# Patient Record
Sex: Male | Born: 1960 | Race: White | Hispanic: No | Marital: Single | Smoking: Never smoker
Health system: Southern US, Community
[De-identification: ages and names within clinical notes are randomized; demographics above are authoritative.]

## PROBLEM LIST (undated history)

## (undated) DIAGNOSIS — G809 Cerebral palsy, unspecified: Secondary | ICD-10-CM

## (undated) HISTORY — PX: ORIF FIBULA FRACTURE: SHX2121

## (undated) HISTORY — PX: TENDON RELEASE: SHX230

---

## 2011-01-29 ENCOUNTER — Observation Stay (HOSPITAL_COMMUNITY): Payer: Worker's Compensation | Admitting: Anesthesiology

## 2011-01-29 ENCOUNTER — Other Ambulatory Visit: Payer: Self-pay

## 2011-01-29 ENCOUNTER — Observation Stay (HOSPITAL_COMMUNITY)
Admission: EM | Admit: 2011-01-29 | Discharge: 2011-01-30 | Disposition: A | Discharge status: Home / Self Care | Payer: Worker's Compensation | Attending: Orthopedic Surgery | Admitting: Orthopedic Surgery

## 2011-01-29 ENCOUNTER — Encounter (HOSPITAL_COMMUNITY): Payer: Self-pay | Admitting: Anesthesiology

## 2011-01-29 ENCOUNTER — Emergency Department (HOSPITAL_COMMUNITY): Payer: Worker's Compensation

## 2011-01-29 ENCOUNTER — Encounter (HOSPITAL_COMMUNITY)
Admission: EM | Disposition: A | Discharge status: Home / Self Care | Payer: Self-pay | Source: Home / Self Care | Attending: Emergency Medicine

## 2011-01-29 ENCOUNTER — Encounter (HOSPITAL_COMMUNITY): Payer: Self-pay

## 2011-01-29 DIAGNOSIS — W540XXA Bitten by dog, initial encounter: Secondary | ICD-10-CM | POA: Insufficient documentation

## 2011-01-29 DIAGNOSIS — S62502B Fracture of unspecified phalanx of left thumb, initial encounter for open fracture: Secondary | ICD-10-CM

## 2011-01-29 DIAGNOSIS — S62639B Displaced fracture of distal phalanx of unspecified finger, initial encounter for open fracture: Principal | ICD-10-CM | POA: Insufficient documentation

## 2011-01-29 DIAGNOSIS — Y990 Civilian activity done for income or pay: Secondary | ICD-10-CM | POA: Insufficient documentation

## 2011-01-29 DIAGNOSIS — Z23 Encounter for immunization: Secondary | ICD-10-CM | POA: Insufficient documentation

## 2011-01-29 DIAGNOSIS — Y929 Unspecified place or not applicable: Secondary | ICD-10-CM | POA: Insufficient documentation

## 2011-01-29 HISTORY — PX: ORIF FINGER FRACTURE: SHX2122

## 2011-01-29 HISTORY — DX: Cerebral palsy, unspecified: G80.9

## 2011-01-29 LAB — POCT I-STAT, CHEM 8
Calcium, Ion: 1.16 mmol/L (ref 1.12–1.32)
Chloride: 106 mEq/L (ref 96–112)
HCT: 43 % (ref 39.0–52.0)
Hemoglobin: 14.6 g/dL (ref 13.0–17.0)
TCO2: 25 mmol/L (ref 0–100)

## 2011-01-29 SURGERY — OPEN REDUCTION INTERNAL FIXATION (ORIF) METACARPAL (FINGER) FRACTURE
Anesthesia: General | Laterality: Left

## 2011-01-29 MED ORDER — HYDROMORPHONE HCL PF 1 MG/ML IJ SOLN
0.2500 mg | INTRAMUSCULAR | Status: DC | PRN
  Administered 2011-01-29 (×2): 0.5 mg via INTRAVENOUS

## 2011-01-29 MED ORDER — CLINDAMYCIN HCL 300 MG PO CAPS: 300.0000 mg | ORAL_CAPSULE | Freq: Three times a day (TID) | ORAL | Status: AC

## 2011-01-29 MED ORDER — SODIUM CHLORIDE 0.9 % IV SOLN
INTRAVENOUS | Status: DC | PRN
  Administered 2011-01-29: 18:00:00 via INTRAVENOUS

## 2011-01-29 MED ORDER — PROPOFOL 10 MG/ML IV EMUL
INTRAVENOUS | Status: DC | PRN
  Administered 2011-01-29: 300 mg via INTRAVENOUS

## 2011-01-29 MED ORDER — GLYCOPYRROLATE 0.2 MG/ML IJ SOLN
INTRAMUSCULAR | Status: DC | PRN
  Administered 2011-01-29: .2 mg via INTRAVENOUS

## 2011-01-29 MED ORDER — BUPIVACAINE HCL (PF) 0.25 % IJ SOLN
INTRAMUSCULAR | Status: DC | PRN
  Administered 2011-01-29: 9 mL

## 2011-01-29 MED ORDER — KCL IN DEXTROSE-NACL 20-5-0.45 MEQ/L-%-% IV SOLN
INTRAVENOUS | Status: DC
  Filled 2011-01-29: qty 1000

## 2011-01-29 MED ORDER — CHLORHEXIDINE GLUCONATE 4 % EX LIQD
60.0000 mL | Freq: Once | CUTANEOUS | Status: DC
  Filled 2011-01-29: qty 60

## 2011-01-29 MED ORDER — ALPRAZOLAM 0.5 MG PO TABS
0.5000 mg | ORAL_TABLET | Freq: Four times a day (QID) | ORAL | Status: DC | PRN
  Administered 2011-01-30: 0.5 mg via ORAL
  Filled 2011-01-29: qty 1

## 2011-01-29 MED ORDER — ZOLPIDEM TARTRATE 5 MG PO TABS: 5.0000 mg | ORAL_TABLET | Freq: Every evening | ORAL | Status: DC | PRN

## 2011-01-29 MED ORDER — FENTANYL CITRATE 0.05 MG/ML IJ SOLN
INTRAMUSCULAR | Status: AC
  Filled 2011-01-29: qty 2

## 2011-01-29 MED ORDER — FENTANYL CITRATE 0.05 MG/ML IJ SOLN
50.0000 ug | INTRAMUSCULAR | Status: DC | PRN
  Administered 2011-01-29: 50 ug via INTRAVENOUS

## 2011-01-29 MED ORDER — CLINDAMYCIN PHOSPHATE 600 MG/4ML IJ SOLN
INTRAMUSCULAR | Status: AC
  Filled 2011-01-29: qty 4

## 2011-01-29 MED ORDER — FENTANYL CITRATE 0.05 MG/ML IJ SOLN
INTRAMUSCULAR | Status: DC | PRN
  Administered 2011-01-29: 100 ug via INTRAVENOUS

## 2011-01-29 MED ORDER — RABIES VACCINE, PCEC IM SUSR
1.0000 mL | Freq: Once | INTRAMUSCULAR | Status: AC
  Administered 2011-01-29: 1 mL via INTRAMUSCULAR
  Filled 2011-01-29: qty 1

## 2011-01-29 MED ORDER — OXYCODONE-ACETAMINOPHEN 10-325 MG PO TABS: 1.0000 | ORAL_TABLET | ORAL | Status: AC | PRN

## 2011-01-29 MED ORDER — HYDROMORPHONE HCL PF 1 MG/ML IJ SOLN
INTRAMUSCULAR | Status: AC
  Filled 2011-01-29: qty 1

## 2011-01-29 MED ORDER — MORPHINE SULFATE 2 MG/ML IJ SOLN: 0.0500 mg/kg | INTRAMUSCULAR | Status: DC | PRN

## 2011-01-29 MED ORDER — METHOCARBAMOL 500 MG PO TABS
500.0000 mg | ORAL_TABLET | Freq: Four times a day (QID) | ORAL | Status: DC | PRN
  Administered 2011-01-30: 500 mg via ORAL
  Filled 2011-01-29: qty 1

## 2011-01-29 MED ORDER — DEXTROSE 5 % IV SOLN
INTRAVENOUS | Status: AC
  Filled 2011-01-29: qty 50

## 2011-01-29 MED ORDER — MEPERIDINE HCL 25 MG/ML IJ SOLN: 6.2500 mg | INTRAMUSCULAR | Status: DC | PRN

## 2011-01-29 MED ORDER — CLINDAMYCIN PHOSPHATE 600 MG/50ML IV SOLN
600.0000 mg | Freq: Three times a day (TID) | INTRAVENOUS | Status: DC
  Administered 2011-01-29 – 2011-01-30 (×2): 600 mg via INTRAVENOUS
  Filled 2011-01-29 (×5): qty 50

## 2011-01-29 MED ORDER — DOCUSATE SODIUM 100 MG PO CAPS
100.0000 mg | ORAL_CAPSULE | Freq: Two times a day (BID) | ORAL | Status: DC
  Administered 2011-01-29 – 2011-01-30 (×2): 100 mg via ORAL
  Filled 2011-01-29 (×3): qty 1

## 2011-01-29 MED ORDER — ONDANSETRON HCL 4 MG/2ML IJ SOLN: 4.0000 mg | Freq: Once | INTRAMUSCULAR | Status: DC | PRN

## 2011-01-29 MED ORDER — EPHEDRINE SULFATE 50 MG/ML IJ SOLN
INTRAMUSCULAR | Status: DC | PRN
  Administered 2011-01-29: 5 mg via INTRAVENOUS
  Administered 2011-01-29: 10 mg via INTRAVENOUS

## 2011-01-29 MED ORDER — CLINDAMYCIN PHOSPHATE 600 MG/50ML IV SOLN
INTRAVENOUS | Status: DC | PRN
  Administered 2011-01-29: 600 mg via INTRAVENOUS

## 2011-01-29 MED ORDER — DOCUSATE SODIUM 100 MG PO CAPS: 100.0000 mg | ORAL_CAPSULE | Freq: Two times a day (BID) | ORAL | Status: AC

## 2011-01-29 MED ORDER — CHLORPROMAZINE HCL 25 MG PO TABS
25.0000 mg | ORAL_TABLET | Freq: Three times a day (TID) | ORAL | Status: DC | PRN
  Filled 2011-01-29: qty 1

## 2011-01-29 MED ORDER — DEXTROSE 5 % IV SOLN
500.0000 mg | Freq: Four times a day (QID) | INTRAVENOUS | Status: DC | PRN
  Filled 2011-01-29: qty 5

## 2011-01-29 MED ORDER — ONDANSETRON HCL 4 MG PO TABS: 4.0000 mg | ORAL_TABLET | Freq: Three times a day (TID) | ORAL | Status: AC | PRN

## 2011-01-29 MED ORDER — CLINDAMYCIN PHOSPHATE 600 MG/50ML IV SOLN
600.0000 mg | INTRAVENOUS | Status: DC
  Filled 2011-01-29: qty 50

## 2011-01-29 MED ORDER — HYDROMORPHONE HCL PF 1 MG/ML IJ SOLN
0.5000 mg | INTRAMUSCULAR | Status: DC | PRN
  Administered 2011-01-29 – 2011-01-30 (×3): 1 mg via INTRAVENOUS
  Filled 2011-01-29 (×3): qty 1

## 2011-01-29 MED ORDER — VITAMIN C 500 MG PO TABS
1000.0000 mg | ORAL_TABLET | Freq: Every day | ORAL | Status: DC
  Administered 2011-01-29 – 2011-01-30 (×2): 1000 mg via ORAL
  Filled 2011-01-29 (×2): qty 2

## 2011-01-29 MED ORDER — SODIUM CHLORIDE 0.9 % IV SOLN
INTRAVENOUS | Status: DC
  Administered 2011-01-29: 16:00:00 via INTRAVENOUS

## 2011-01-29 MED ORDER — ONDANSETRON HCL 4 MG/2ML IJ SOLN
4.0000 mg | Freq: Four times a day (QID) | INTRAMUSCULAR | Status: DC | PRN
  Administered 2011-01-30: 4 mg via INTRAVENOUS
  Filled 2011-01-29: qty 2

## 2011-01-29 MED ORDER — CEFAZOLIN SODIUM-DEXTROSE 2-3 GM-% IV SOLR
2.0000 g | INTRAVENOUS | Status: AC
  Administered 2011-01-29: 2 g via INTRAVENOUS
  Filled 2011-01-29 (×2): qty 50

## 2011-01-29 MED ORDER — HYDROCODONE-ACETAMINOPHEN 5-325 MG PO TABS: 1.0000 | ORAL_TABLET | ORAL | Status: DC | PRN

## 2011-01-29 MED ORDER — DIPHENHYDRAMINE HCL 25 MG PO CAPS: 25.0000 mg | ORAL_CAPSULE | Freq: Four times a day (QID) | ORAL | Status: DC | PRN

## 2011-01-29 MED ORDER — OXYCODONE HCL 5 MG PO TABS
5.0000 mg | ORAL_TABLET | ORAL | Status: DC | PRN
  Administered 2011-01-29: 5 mg via ORAL
  Administered 2011-01-30 (×2): 10 mg via ORAL
  Filled 2011-01-29 (×2): qty 2
  Filled 2011-01-29: qty 1

## 2011-01-29 MED ORDER — ADULT MULTIVITAMIN W/MINERALS CH
1.0000 | ORAL_TABLET | Freq: Every day | ORAL | Status: DC
  Administered 2011-01-29 – 2011-01-30 (×2): 1 via ORAL
  Filled 2011-01-29 (×2): qty 1

## 2011-01-29 MED ORDER — FENTANYL CITRATE 0.05 MG/ML IJ SOLN
50.0000 ug | INTRAMUSCULAR | Status: AC | PRN
  Administered 2011-01-29 (×2): 50 ug via INTRAVENOUS
  Filled 2011-01-29 (×2): qty 2

## 2011-01-29 MED ORDER — KCL IN DEXTROSE-NACL 20-5-0.45 MEQ/L-%-% IV SOLN
INTRAVENOUS | Status: DC
  Administered 2011-01-29: 22:00:00 via INTRAVENOUS
  Filled 2011-01-29 (×2): qty 1000

## 2011-01-29 MED ORDER — ONDANSETRON HCL 4 MG/2ML IJ SOLN
INTRAMUSCULAR | Status: DC | PRN
  Administered 2011-01-29: 4 mg via INTRAVENOUS

## 2011-01-29 MED ORDER — CLONAZEPAM 2 MG PO TABS
2.0000 mg | ORAL_TABLET | Freq: Every day | ORAL | Status: DC
  Administered 2011-01-30: 2 mg via ORAL
  Filled 2011-01-29: qty 2

## 2011-01-29 MED ORDER — EPINEPHRINE HCL 0.1 MG/ML IJ SOLN
INTRAMUSCULAR | Status: AC
  Filled 2011-01-29: qty 80

## 2011-01-29 MED ORDER — ONDANSETRON HCL 4 MG PO TABS: 4.0000 mg | ORAL_TABLET | Freq: Four times a day (QID) | ORAL | Status: DC | PRN

## 2011-01-29 MED ORDER — MIDAZOLAM HCL 5 MG/5ML IJ SOLN
INTRAMUSCULAR | Status: DC | PRN
  Administered 2011-01-29: 2 mg via INTRAVENOUS

## 2011-01-29 MED ORDER — ASPIRIN 325 MG PO TABS
325.0000 mg | ORAL_TABLET | Freq: Two times a day (BID) | ORAL | Status: DC
  Administered 2011-01-29 – 2011-01-30 (×2): 325 mg via ORAL
  Filled 2011-01-29 (×3): qty 1

## 2011-01-29 MED ORDER — LACTATED RINGERS IV SOLN
INTRAVENOUS | Status: DC | PRN
  Administered 2011-01-29: 19:00:00 via INTRAVENOUS

## 2011-01-29 SURGICAL SUPPLY — 53 items
BANDAGE CONFORM 2  STR LF (GAUZE/BANDAGES/DRESSINGS) ×2 IMPLANT
BANDAGE ELASTIC 3 VELCRO ST LF (GAUZE/BANDAGES/DRESSINGS) IMPLANT
BANDAGE ELASTIC 4 VELCRO ST LF (GAUZE/BANDAGES/DRESSINGS) ×4 IMPLANT
BANDAGE GAUZE ELAST BULKY 4 IN (GAUZE/BANDAGES/DRESSINGS) IMPLANT
BNDG COHESIVE 1X5 TAN STRL LF (GAUZE/BANDAGES/DRESSINGS) ×2 IMPLANT
BNDG ELASTIC 2 VLCR STRL LF (GAUZE/BANDAGES/DRESSINGS) IMPLANT
BNDG ESMARK 4X9 LF (GAUZE/BANDAGES/DRESSINGS) ×2 IMPLANT
CAP PIN ORTHO PINK (CAP) IMPLANT
CAP PIN PROTECTOR ORTHO WHT (CAP) IMPLANT
CLOTH BEACON ORANGE TIMEOUT ST (SAFETY) ×2 IMPLANT
CORDS BIPOLAR (ELECTRODE) ×2 IMPLANT
COVER SURGICAL LIGHT HANDLE (MISCELLANEOUS) ×2 IMPLANT
CUFF TOURNIQUET SINGLE 18IN (TOURNIQUET CUFF) ×2 IMPLANT
CUFF TOURNIQUET SINGLE 24IN (TOURNIQUET CUFF) IMPLANT
DRAPE OEC MINIVIEW 54X84 (DRAPES) ×2 IMPLANT
DRAPE SURG 17X23 STRL (DRAPES) ×2 IMPLANT
DRSG ADAPTIC 3X8 NADH LF (GAUZE/BANDAGES/DRESSINGS) IMPLANT
DRSG XEROFORM 1X8 (GAUZE/BANDAGES/DRESSINGS) ×2 IMPLANT
GAUZE SPONGE 2X2 8PLY STRL LF (GAUZE/BANDAGES/DRESSINGS) ×1 IMPLANT
GAUZE XEROFORM 5X9 LF (GAUZE/BANDAGES/DRESSINGS) ×2 IMPLANT
GLOVE BIOGEL PI IND STRL 8.5 (GLOVE) ×1 IMPLANT
GLOVE BIOGEL PI INDICATOR 8.5 (GLOVE) ×1
GLOVE SURG ORTHO 8.0 STRL STRW (GLOVE) ×2 IMPLANT
GOWN PREVENTION PLUS XLARGE (GOWN DISPOSABLE) ×4 IMPLANT
GOWN STRL NON-REIN LRG LVL3 (GOWN DISPOSABLE) IMPLANT
K-WIRE SMTH SNGL TROCAR .028X4 (WIRE)
KIT BASIN OR (CUSTOM PROCEDURE TRAY) ×2 IMPLANT
KIT ROOM TURNOVER OR (KITS) ×2 IMPLANT
KWIRE 4.0 X .045IN (WIRE) ×4 IMPLANT
KWIRE SMTH SNGL TROCAR .028X4 (WIRE) IMPLANT
MANIFOLD NEPTUNE II (INSTRUMENTS) ×2 IMPLANT
NEEDLE HYPO 25GX1X1/2 BEV (NEEDLE) ×2 IMPLANT
NS IRRIG 1000ML POUR BTL (IV SOLUTION) ×2 IMPLANT
PACK ORTHO EXTREMITY (CUSTOM PROCEDURE TRAY) ×2 IMPLANT
PAD ARMBOARD 7.5X6 YLW CONV (MISCELLANEOUS) ×4 IMPLANT
PAD CAST 4YDX4 CTTN HI CHSV (CAST SUPPLIES) IMPLANT
PADDING CAST COTTON 4X4 STRL (CAST SUPPLIES)
PADDING UNDERCAST 2  STERILE (CAST SUPPLIES) IMPLANT
SOAP 2 % CHG 4 OZ (WOUND CARE) ×2 IMPLANT
SPLINT FINGER W/BULB (SOFTGOODS) ×2 IMPLANT
SPONGE GAUZE 2X2 8PLY STRL LF (GAUZE/BANDAGES/DRESSINGS) ×2 IMPLANT
SPONGE GAUZE 2X2 STER 10/PKG (GAUZE/BANDAGES/DRESSINGS) ×1
SPONGE GAUZE 4X4 12PLY (GAUZE/BANDAGES/DRESSINGS) IMPLANT
SUCTION FRAZIER TIP 10 FR DISP (SUCTIONS) ×2 IMPLANT
SUT CHROMIC 5 0 P 3 (SUTURE) ×2 IMPLANT
SUT MERSILENE 4 0 P 3 (SUTURE) IMPLANT
SUT PROLENE 4 0 PS 2 18 (SUTURE) ×4 IMPLANT
SYR CONTROL 10ML LL (SYRINGE) ×2 IMPLANT
TOWEL OR 17X24 6PK STRL BLUE (TOWEL DISPOSABLE) ×2 IMPLANT
TOWEL OR 17X26 10 PK STRL BLUE (TOWEL DISPOSABLE) ×2 IMPLANT
TUBE CONNECTING 12X1/4 (SUCTIONS) ×2 IMPLANT
UNDERPAD 30X30 INCONTINENT (UNDERPADS AND DIAPERS) ×2 IMPLANT
WATER STERILE IRR 1000ML POUR (IV SOLUTION) IMPLANT

## 2011-01-29 NOTE — H&P (Signed)
Trevor Lowe is an 50 y.o. male.   Chief Complaint: DOG BITE TO LEFT THUMB HPI: PT AT WORK SUSTAINED DOG BITE PT WITH OPEN WOUND TO LEFT THUMB TIP AND NEAR AMPUTATION PT HERE FOR TREATMENT AND SURGERY ON LEFT THUMB RABIES VACCINE STARTED IN ED   Past Medical History  Diagnosis Date  . Cerebral palsy     History reviewed. No pertinent past surgical history.  History reviewed. No pertinent family history. Social History:  reports that he has never smoked. He does not have any smokeless tobacco history on file. He reports that he drinks alcohol. He reports that he does not use illicit drugs.  Allergies:  Allergies  Allergen Reactions  . Codeine Nausea And Vomiting    Medications Prior to Admission  Medication Dose Route Frequency Provider Last Rate Last Dose  . 0.9 %  sodium chloride infusion   Intravenous Continuous Laray Anger, DO 100 mL/hr at 01/29/11 1628    . ceFAZolin (ANCEF) IVPB 2 g/50 mL premix  2 g Intravenous To Major Laray Anger, DO   2 g at 01/29/11 1628  . chlorhexidine (HIBICLENS) 4 % liquid 4 application  60 mL Topical Once Sharma Covert, MD      . clindamycin (CLEOCIN) 600 MG/4ML injection           . clindamycin (CLEOCIN) IVPB 600 mg  600 mg Intravenous 60 min Pre-Op Sharma Covert, MD      . dextrose 5 % and 0.45 % NaCl with KCl 20 mEq/L infusion   Intravenous Continuous Sharma Covert, MD      . dextrose 5 % solution           . fentaNYL (SUBLIMAZE) injection 50 mcg  50 mcg Intravenous Q30 min PRN Laray Anger, DO   50 mcg at 01/29/11 1619  . rabies vaccine, PCEC (RABAVERT) injection 1 mL  1 mL Intramuscular Once Laray Anger, DO   1 mL at 01/29/11 1613   No current outpatient prescriptions on file as of 01/29/2011.    Results for orders placed during the hospital encounter of 01/29/11 (from the past 48 hour(s))  POCT I-STAT, CHEM 8     Status: Abnormal   Collection Time   01/29/11  3:27 PM      Component Value Range Comment   Sodium 142  135 - 145 (mEq/L)    Potassium 3.7  3.5 - 5.1 (mEq/L)    Chloride 106  96 - 112 (mEq/L)    BUN 12  6 - 23 (mg/dL)    Creatinine, Ser 0.63  0.50 - 1.35 (mg/dL)    Glucose, Bld 016 (*) 70 - 99 (mg/dL)    Calcium, Ion 0.10  1.12 - 1.32 (mmol/L)    TCO2 25  0 - 100 (mmol/L)    Hemoglobin 14.6  13.0 - 17.0 (g/dL)    HCT 93.2  35.5 - 73.2 (%)    Dg Hand 2 View Left  01/29/2011  *RADIOLOGY REPORT*  Clinical Data: Dog bite, laceration to the.  LEFT HAND - 2 VIEW  Comparison: None.  Findings: There is a fracture through the metaphysis of the distal phalanx of the first digit.  There is a ventral displacement by approximately one bone width.  Fracture does not appear to enter the articular surface.  There is a small 3 mm sliver like foreign body along the radial side of the first metacarpal.  IMPRESSION:  Horizontal metaphyseal fracture of the distal phalanx,  first digit on the right.  Small foreign body along the radial surface of the first metacarpal.  Original Report Authenticated By: Genevive Bi, M.D.    NO RECENT ILLNESSES OR HOSPITALIZATIONS  Blood pressure 118/65, pulse 90, temperature 98.2 F (36.8 C), temperature source Oral, resp. rate 20, SpO2 95.00%. General Appearance:  Alert, cooperative, no distress, appears stated age  Head:  Normocephalic, without obvious abnormality, atraumatic  Eyes:  Pupils equal, conjunctiva/corneas clear,         Throat: Lips, mucosa, and tongue normal; teeth and gums normal  Neck: No visible masses     Lungs:   respirations unlabored  Chest Wall:  No tenderness or deformity  Heart:  Regular rate and rhythm,  Abdomen:   Soft, non-tender,         Extremities: LEFT THUMB: NEAR AMPUTATION TO LEFT THUMB, THUMB DISTAL TIP PERFUSED. UNABLE TO FLEX THUMB IP JOINT. SENSATION TO LT ALTERED DISTALLY SMALL PUNCTURE WOUNDS TO INDEX AND LONG NO FULL THICKNESS WOUNDS GOOD DIGITAL MOBILITY  Pulses: 2+ and symmetric  Skin: Skin color, texture, turgor  normal, no rashes or lesions     Neurologic: Normal    Assessment/Plan Left thumb open distal phalanx fracture from dog bite  I/D of the left thumb and open reduction and internal fixation of thumb distal phalanx fracture  R/B/A DISCUSSED WITH PT IN HOLDING AREA  PT VOICED UNDERSTANDING OF PLAN CONSENT SIGNED DAY OF SURGERY PT SEEN AND EXAMINED PRIOR TO OPERATIVE PROCEDURE/DAY OF SURGERY SITE MARKED. QUESTIONS ANSWERED WILL BE ADMITTED FOLLOWING SURGERY  Sharma Covert 01/29/2011, 5:54 PM

## 2011-01-29 NOTE — Transfer of Care (Signed)
Immediate Anesthesia Transfer of Care Note  Patient: Trevor Lowe  Procedure(s) Performed:  OPEN REDUCTION INTERNAL FIXATION (ORIF) METACARPAL (FINGER) FRACTURE  Patient Location: PACU  Anesthesia Type: General  Level of Consciousness: awake, alert  and oriented  Airway & Oxygen Therapy: Patient Spontanous Breathing and Patient connected to nasal cannula oxygen  Post-op Assessment: Report given to PACU RN and Post -op Vital signs reviewed and stable  Post vital signs: Reviewed and stable  Complications: No apparent anesthesia complications

## 2011-01-29 NOTE — Anesthesia Postprocedure Evaluation (Signed)
  Anesthesia Post-op Note  Patient: Trevor Lowe  Procedure(s) Performed:  OPEN REDUCTION INTERNAL FIXATION (ORIF) METACARPAL (FINGER) FRACTURE  Patient Location: PACU  Anesthesia Type: General  Level of Consciousness: awake, alert  and oriented  Airway and Oxygen Therapy: Patient Spontanous Breathing and Patient connected to nasal cannula oxygen  Post-op Pain: moderate  Post-op Assessment: Post-op Vital signs reviewed, Patient's Cardiovascular Status Stable, Respiratory Function Stable, Patent Airway, No signs of Nausea or vomiting and Pain level controlled  Post-op Vital Signs: Reviewed and stable  Complications: No apparent anesthesia complications

## 2011-01-29 NOTE — Anesthesia Procedure Notes (Signed)
Procedure Name: Intubation Date/Time: 01/29/2011 6:22 PM Performed by: Julianne Rice Z Pre-anesthesia Checklist: Patient identified, Timeout performed, Emergency Drugs available, Suction available and Patient being monitored Patient Re-evaluated:Patient Re-evaluated prior to inductionOxygen Delivery Method: Circle System Utilized Preoxygenation: Pre-oxygenation with 100% oxygen Intubation Type: IV induction Ventilation: Mask ventilation without difficulty Laryngoscope Size: Mac and 4 Grade View: Grade I Tube type: Oral Tube size: 8.0 mm Number of attempts: 1 Airway Equipment and Method: stylet Placement Confirmation: ETT inserted through vocal cords under direct vision,  breath sounds checked- equal and bilateral and positive ETCO2 Secured at: 23 cm Tube secured with: Tape Dental Injury: Teeth and Oropharynx as per pre-operative assessment

## 2011-01-29 NOTE — Brief Op Note (Signed)
01/29/2011  7:19 PM  PATIENT:  Trevor Lowe  51 y.o. male  PRE-OPERATIVE DIAGNOSIS:  dog bite  POST-OPERATIVE DIAGNOSIS:  * No post-op diagnosis entered *  PROCEDURE:  Procedure(s): OPEN REDUCTION INTERNAL FIXATION (ORIF) METACARPAL (FINGER) FRACTURE  SURGEON:  Surgeon(s): Sharma Covert, MD  PHYSICIAN ASSISTANT:   ASSISTANTS: none   ANESTHESIA:   general  EBL:     BLOOD ADMINISTERED:none  DRAINS: none   LOCAL MEDICATIONS USED:  MARCAINE 9CC  SPECIMEN:  No Specimen  DISPOSITION OF SPECIMEN:  N/A  COUNTS:  YES  TOURNIQUET:  * Missing tourniquet times found for documented tourniquets in log:  21197 *  DICTATION: .Note written in EPIC  PLAN OF CARE: Admit for overnight observation  PATIENT DISPOSITION:  PACU - hemodynamically stable.   Delay start of Pharmacological VTE agent (>24hrs) due to surgical blood loss or risk of bleeding:  {YES/NO/NOT APPLICABLE:20182

## 2011-01-29 NOTE — Op Note (Signed)
PREOPERATIVE DIAGNOSIS: Open left thumb distal phalanx fracture.  Dog Bite  POSTOPERATIVE DIAGNOSIS: Open left thumb distal phalanx fracture Dog Bite  ATTENDING PHYSICIAN: Sharma Covert IV, MD who was scrubbed and present  for the entire procedure.   ASSISTANT SURGEON: None.   ANESTHESIA: General via LMA.   SURGICAL PROCEDURE:  1. Open debridement of open distal phalangeal fracture, debridement of  skin, subcutaneous tissue, and bone associated with open fracture.  2. Open treatment of left thumb distal phalanx fracture requiring  internal fixation.  3. Left thumb nail bed repair.  4. Radiographs 2 view of the left thumb.   SURGICAL IMPLANTS: One 0.045 K-wire.  SURGICAL INDICATIONS: Trevor Lowe is a 51 year old gentleman who  sustained an open thumb injury while at work, bitten by a dog. Risks, benefits, and  alternatives were discussed in detail with the patient and signed  informed consent was obtained. Risks include, but not limited to  bleeding, infection, damage to nearby nerves, arteries, or tendons, loss  of motion of the elbow, wrist and digits, and need for further surgical  intervention.   DESCRIPTION OF PROCEDURE: The patient was properly identified in the  preop holding area and mark with a permanent marker made on the left  thumb to indicate the correct operative site. The patient then brought  back to the operating room, placed supine on anesthesia room table and  general anesthesia was administered. The patient tolerated this well.  Well-padded tourniquet was then placed on the left brachium and sealed  with a 1000 drape. The left upper extremity was then prepped and draped  in normal sterile fashion. Time-out was called, correct side was  identified, and procedure was then begun. Attention then turned to the  left thumb. The open fracture site skin was then extended both  proximally and distally. Excisional debridement was then carried out  sharply with  knife, curettes, and rongeurs at the open fracture site of  the skin, subcutaneous tissue, and small bone fragments. The wound was  then copiously irrigated after excisional debridement. Thorough wound  irrigation done throughout. Following this, the fracture line extending  through the most proximal area of the germinal matrix. The 0.045 K-wire  was then placed in an antegrade direction and then drilled retrograde  across the fracture site across the distal interphalangeal joint for  additional purchase. This was confirmed using mini C-arm in all planes.  The K-wire was then cut and bent and left out of the skin. After  internal fixation of distal phalangeal fracture, the wound was then  thoroughly irrigated. After the wound was thoroughly irrigated, the  nail bed was then repaired with simple 5-0 chromic sutures. After  repair of the nail bed, the tourniquet was deflated with good perfusion  of the thumb tip. The skin was then closed using simple Prolene  sutures. Adaptic dressing and sterile compressive bandage then applied.  An 8 mL of 0.25% Marcaine infiltrated locally. The patient was then  placed in a small finger splint, extubated, and taken to recovery room  in good condition.   Intraoperative radiographic 2 views of the finger and the thumb do show  the internal fixation in place with good reduction of the phalangeal  fracture.   POSTOPERATIVE PLAN: The patient discharged home, seen back in the  office in approximately 7 days for wound check, suture removal,  application of a tip protector splint pinned in for a total of 4 weeks,  and then begin DIP motion at  the 4 week mark x-rays at each visit.

## 2011-01-29 NOTE — ED Provider Notes (Signed)
History     CSN: [id number]  Arrival date & time 01/29/11  1421   Chief Complaint  Patient presents with  . Animal Bite    HPI Pt was seen at 1440.   Per EMS and pt, c/o sudden onset and persistence of constant left hand pain that began PTA.  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  Pt is right handed.  Denies any other injuries.       Past Medical History  Diagnosis Date  . Cerebral palsy     History reviewed. No pertinent past surgical history.   History  Substance Use Topics  . Smoking status: Never Smoker   . Smokeless tobacco: Not on file  . Alcohol Use: Yes     social     Review of Systems ROS: Statement: All systems negative except as marked or noted in the HPI; Constitutional: Negative for fever and chills. ; ; Eyes: Negative for eye pain, redness and discharge. ; ; ENMT: Negative for ear pain, hoarseness, nasal congestion, sinus pressure and sore throat. ; ; Cardiovascular: Negative for chest pain, palpitations, diaphoresis, dyspnea and peripheral edema. ; ; Respiratory: Negative for cough, wheezing and stridor. ; ; Gastrointestinal: Negative for nausea, vomiting, diarrhea, abdominal pain, blood in stool, hematemesis, jaundice and rectal bleeding. . ; ; Genitourinary: Negative for dysuria, flank pain and hematuria. ; ; Musculoskeletal: Negative for back pain and neck pain. Negative for swelling and +left hand trauma.; ; Skin: Negative for pruritus, rash, blisters, bruising and skin lesion.; ; Neuro: Negative for headache, lightheadedness and neck stiffness. Negative for weakness, altered level of consciousness , altered mental status, extremity weakness, paresthesias, involuntary movement, seizure and syncope.     Allergies  Codeine  Home Medications   Current Outpatient Rx  Name Route Sig Dispense Refill  . CLONAZEPAM 1 MG PO TABS Oral Take 2 mg by mouth daily.      BP 118/65  Pulse 90  Temp(Src) 98.2 F (36.8 C) (Oral)   Resp 20  SpO2 95%  Physical Exam 1445: Physical examination:  Nursing notes reviewed; Vital signs and O2 SAT reviewed;  Constitutional: Well developed, Well nourished, Well hydrated, In no acute distress; Head:  Normocephalic, atraumatic; Eyes: EOMI, PERRL, No scleral icterus; ENMT: Mouth and pharynx normal, Mucous membranes moist; Neck: Supple, Full range of motion, No lymphadenopathy; Cardiovascular: Regular rate and rhythm, No murmur, rub, or gallop; Respiratory: Breath sounds clear & equal bilaterally, No rales, rhonchi, wheezes, or rub, Normal respiratory effort/excursion; Chest: Nontender, Movement normal; Abdomen: Soft, Nontender, Nondistended, Normal bowel sounds; Extremities: Pulses normal, +left distal thumb from IP to tip avulsed circumferentially; +entire thumb is TTP with tip of thumb pink.  Pt holding his left thumb and index finger in adduction and flexion and will not fully extend either due to c/o pain, otherwise no other obvious left hand or wrist injuries with fingers held in this position.  Other fingers appear pink/warm. No calf edema or asymmetry.; Neuro: AA&Ox3, Major CN grossly intact.  No gross focal motor or sensory deficits in extremities.; Skin: Color normal, Warm, Dry, Psych:  Anxious.   ED Course  Procedures   MDM  MDM Reviewed: nursing note and vitals Interpretation: x-ray and ECG    Date: 01/29/2011  Rate: 73  Rhythm: normal sinus rhythm  QRS Axis: normal  Intervals: normal  ST/T Wave abnormalities: normal  Conduction Disutrbances:none  Narrative Interpretation:   Old EKG Reviewed: none available.    Dg  Hand 2 View Left 01/29/2011  *RADIOLOGY REPORT*  Clinical Data: Dog bite, laceration to the.  LEFT HAND - 2 VIEW  Comparison: None.  Findings: There is a fracture through the metaphysis of the distal phalanx of the first digit.  There is a ventral displacement by approximately one bone width.  Fracture does not appear to enter the articular surface.  There  is a small 3 mm sliver like foreign body along the radial side of the first metacarpal.  IMPRESSION:  Horizontal metaphyseal fracture of the distal phalanx, first digit on the right.  Small foreign body along the radial surface of the first metacarpal.  Original Report Authenticated By: Genevive Bi, M.D.     1545:   Pt states his Td is UTD, needs rabies post-exposure vaccine booster per [facility] website (pt has already received vaccination series).  Dx testing d/w pt.  Questions answered.  Verb understanding, agreeable to admit/to OR with Hydrograph[employer] surveyor.  Has been NPO since approx 0900 this morning.  Will dose IV abx.  T/C to Hand Surgeon Dr. Orlan Leavens, case discussed, including:  HPI, pertinent PM/SHx, VS/PE, dx testing, ED course and treatment.  Agreeable to admit/take to OR.  He has placed orders in computer for ED RN, ED RN made aware.        Marlina Cataldi Allison Quarry, DO 01/30/11 1936

## 2011-01-29 NOTE — ED Notes (Signed)
Pt to the OR, report given

## 2011-01-29 NOTE — Anesthesia Preprocedure Evaluation (Addendum)
Anesthesia Evaluation  Patient identified by MRN, date of birth, ID band Patient awake    Reviewed: Allergy & Precautions, H&P , NPO status , Patient's Chart, lab work & pertinent test results  Airway Mallampati: I TM Distance: >3 FB   Mouth opening: Limited Mouth Opening  Dental  (+) Teeth Intact and Dental Advisory Given   Pulmonary  clear to auscultation        Cardiovascular Regular Normal    Neuro/Psych    GI/Hepatic   Endo/Other    Renal/GU      Musculoskeletal   Abdominal   Peds  Hematology   Anesthesia Other Findings   Reproductive/Obstetrics                          Anesthesia Physical Anesthesia Plan  ASA: II and Emergent  Anesthesia Plan: General   Post-op Pain Management:    Induction: Intravenous  Airway Management Planned: Oral ETT  Additional Equipment:   Intra-op Plan:   Post-operative Plan: Extubation in OR  Informed Consent: I have reviewed the patients History and Physical, chart, labs and discussed the procedure including the risks, benefits and alternatives for the proposed anesthesia with the patient or authorized representative who has indicated his/her understanding and acceptance.   Dental advisory given  Plan Discussed with: CRNA, Anesthesiologist and Surgeon  Anesthesia Plan Comments:        Anesthesia Quick Evaluation

## 2011-01-29 NOTE — ED Notes (Signed)
EMS reports male rotweiller with puppies, lunged at catchpole and bit left hand, thumb amputated, fingers avulsed, 16 mg morphine enroute,

## 2011-01-29 NOTE — Preoperative (Signed)
Beta Blockers   Reason not to administer Beta Blockers:Not Applicable 

## 2011-01-29 NOTE — ED Notes (Signed)
Patient undressed and in gown. All belongings bagged, Pulse ox and bp cuff on.

## 2011-01-29 NOTE — ED Notes (Signed)
Pt is Trevor Lowe

## 2011-01-30 ENCOUNTER — Encounter (HOSPITAL_COMMUNITY): Payer: Self-pay | Admitting: *Deleted

## 2011-01-31 ENCOUNTER — Encounter (HOSPITAL_COMMUNITY): Payer: Self-pay | Admitting: Orthopedic Surgery

## 2011-02-01 ENCOUNTER — Emergency Department (INDEPENDENT_AMBULATORY_CARE_PROVIDER_SITE_OTHER)
Admission: EM | Admit: 2011-02-01 | Discharge: 2011-02-01 | Disposition: A | Discharge status: Home / Self Care | Payer: Worker's Compensation | Source: Home / Self Care | Attending: Family Medicine | Admitting: Family Medicine

## 2011-02-01 ENCOUNTER — Encounter (HOSPITAL_COMMUNITY): Payer: Self-pay | Admitting: *Deleted

## 2011-02-01 DIAGNOSIS — Z23 Encounter for immunization: Secondary | ICD-10-CM

## 2011-02-01 MED ORDER — RABIES VACCINE, PCEC IM SUSR
1.0000 mL | Freq: Once | INTRAMUSCULAR | Status: AC
  Administered 2011-02-01: 1 mL via INTRAMUSCULAR

## 2011-02-01 MED ORDER — RABIES VACCINE, PCEC IM SUSR
INTRAMUSCULAR | Status: AC
  Filled 2011-02-01: qty 1

## 2011-02-01 NOTE — ED Notes (Signed)
Discussed pt.'s intractable pain with Dr. Artis Flock. He said to call the the surgeon and ask him what to do.  I called [phone] and they are going to page Dr. Melvyn Novas to call me.

## 2011-02-01 NOTE — ED Notes (Signed)
Pt. here for 2nd rabies vaccine for dog bite to L thumb. Pt. has large splint and bandage on L thumb. He is supposed to f/u with Dr. Melvyn Novas on 2/5.  Pt. rates pain 10/10

## 2011-02-01 NOTE — ED Notes (Signed)
Dr. Melvyn Novas called back and I told him about pt.'s pain and swelling. I told him his f/u appt. is not until 2/5. He said I could loosen the bandage and he will see the pt. 1/31 @ 0900. Pt. notified of change in appt. I loosened the coban around his wrist and the base of his thumb. Pt. instructed to keep hand elevated as much as possible.

## 2011-02-05 ENCOUNTER — Emergency Department (INDEPENDENT_AMBULATORY_CARE_PROVIDER_SITE_OTHER)
Admission: EM | Admit: 2011-02-05 | Discharge: 2011-02-05 | Disposition: A | Discharge status: Home / Self Care | Payer: Worker's Compensation | Source: Home / Self Care

## 2011-02-05 ENCOUNTER — Encounter (HOSPITAL_COMMUNITY): Payer: Self-pay | Admitting: Emergency Medicine

## 2011-02-05 DIAGNOSIS — Z23 Encounter for immunization: Secondary | ICD-10-CM

## 2011-02-05 MED ORDER — RABIES VACCINE, PCEC IM SUSR
INTRAMUSCULAR | Status: AC
  Filled 2011-02-05: qty 1

## 2011-02-05 MED ORDER — RABIES VACCINE, PCEC IM SUSR
1.0000 mL | Freq: Once | INTRAMUSCULAR | Status: AC
  Administered 2011-02-05: 1 mL via INTRAMUSCULAR

## 2011-02-05 NOTE — ED Notes (Signed)
Patient concerned for receiving unnecessary injections.  spoke to pharmacist , assured patient no consequences to receiving unnecessary injections, nor tolerance--specifically asked theses questions due to patient concern.  Patient agreed to injection

## 2011-02-05 NOTE — ED Notes (Signed)
Patient has returned to [facility] for 3rd injection in rabies series.

## 2011-02-09 ENCOUNTER — Telehealth (HOSPITAL_COMMUNITY): Payer: Self-pay | Admitting: *Deleted

## 2011-02-11 ENCOUNTER — Encounter (HOSPITAL_COMMUNITY): Payer: Self-pay | Admitting: *Deleted

## 2011-02-11 ENCOUNTER — Emergency Department (INDEPENDENT_AMBULATORY_CARE_PROVIDER_SITE_OTHER)
Admission: EM | Admit: 2011-02-11 | Discharge: 2011-02-11 | Disposition: A | Discharge status: Home / Self Care | Payer: Worker's Compensation | Source: Home / Self Care

## 2011-02-11 DIAGNOSIS — Z23 Encounter for immunization: Secondary | ICD-10-CM

## 2011-02-11 MED ORDER — RABIES VACCINE, PCEC IM SUSR
INTRAMUSCULAR | Status: AC
  Filled 2011-02-11: qty 1

## 2011-02-11 MED ORDER — RABIES VACCINE, PCEC IM SUSR
1.0000 mL | Freq: Once | INTRAMUSCULAR | Status: AC
  Administered 2011-02-11: 1 mL via INTRAMUSCULAR

## 2011-02-11 NOTE — ED Notes (Signed)
Pt. Here for last rabies vaccine for dog bite to L thumb. Pt. Was seen by Dr.Ortmann on 2/5 and it was not infected.  Seen today at PT and it was seen by Dr. Melvyn Novas also and redressed.  Has PT on 12/12, 12/13 and 12/15.

## 2011-02-24 NOTE — Discharge Summary (Signed)
NAME:  CYRIS, MAALOUF NO.:  [id number]  MEDICAL RECORD NO.:  [id number]  LOCATION:  5007                         FACILITY:  [facility]  PHYSICIAN:  Madelynn Done, MD  DATE OF BIRTH:  29-Apr-1960  DATE OF ADMISSION:  01/29/2011 DATE OF DISCHARGE:  01/30/2011                              DISCHARGE SUMMARY   ADMISSION DIAGNOSIS:  Left arm dog bite with open distal phalanx fracture.  DISCHARGE DIAGNOSIS:  Left arm dog bite with open distal phalanx fracture.  DISCHARGE MEDICATIONS:  See the computer.  HOSPITAL COURSE:  The patient tolerated overnight IV antibiotics and IV pain medications, felt ready for discharge on the day on January 30, 2011.  CONDITION AT DISCHARGE:  Good.  RECOMMENDATIONS AND FOLLOWUP:  In the computer.     Madelynn Done, MD     FWO/MEDQ  D:  02/23/2011  T:  02/23/2011  Job:  [id number]

## 2012-12-18 IMAGING — CR DG HAND 2V*L*
2 series · 2 of 2 positions shown · non-contrast
Comparison: None.

CLINICAL DATA: Dog bite, laceration to the.

LEFT HAND - 2 VIEW

[PA]
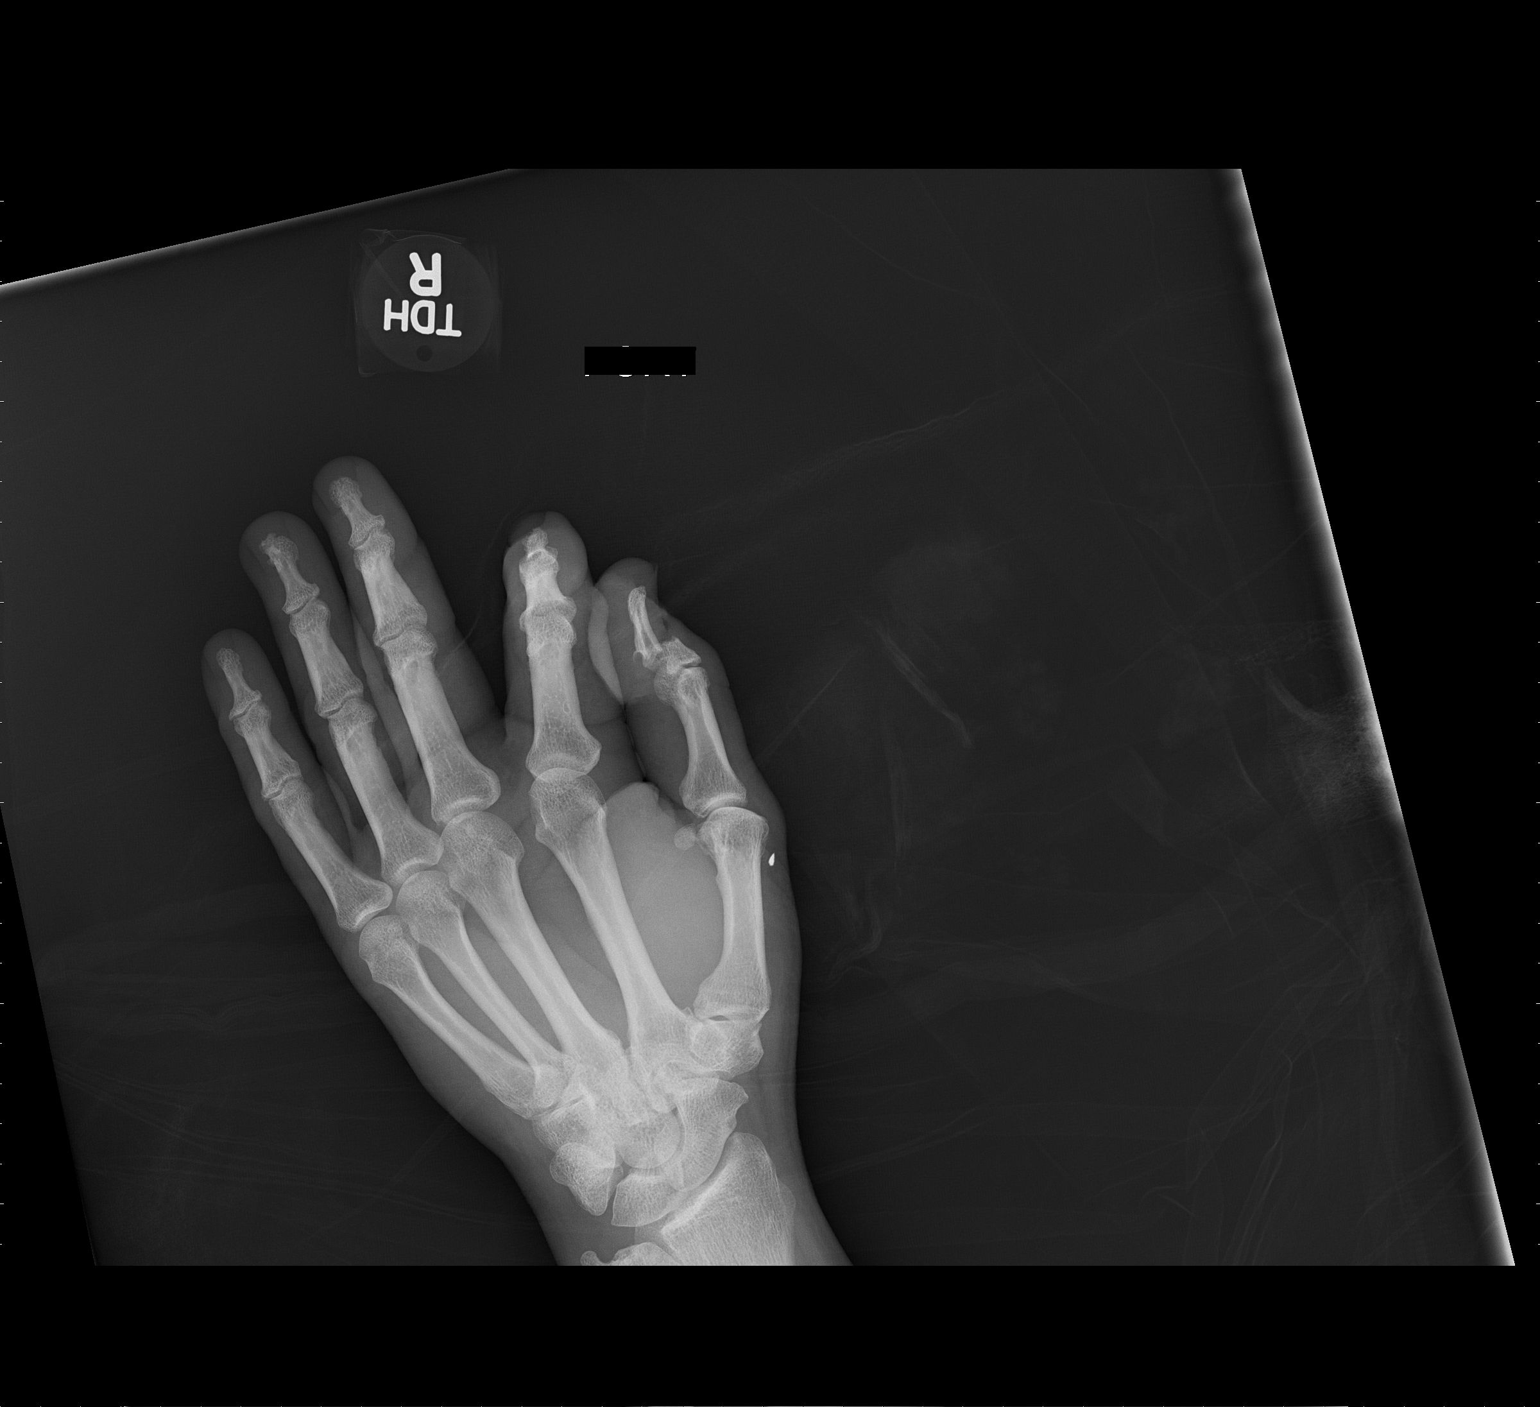

[lat hand]
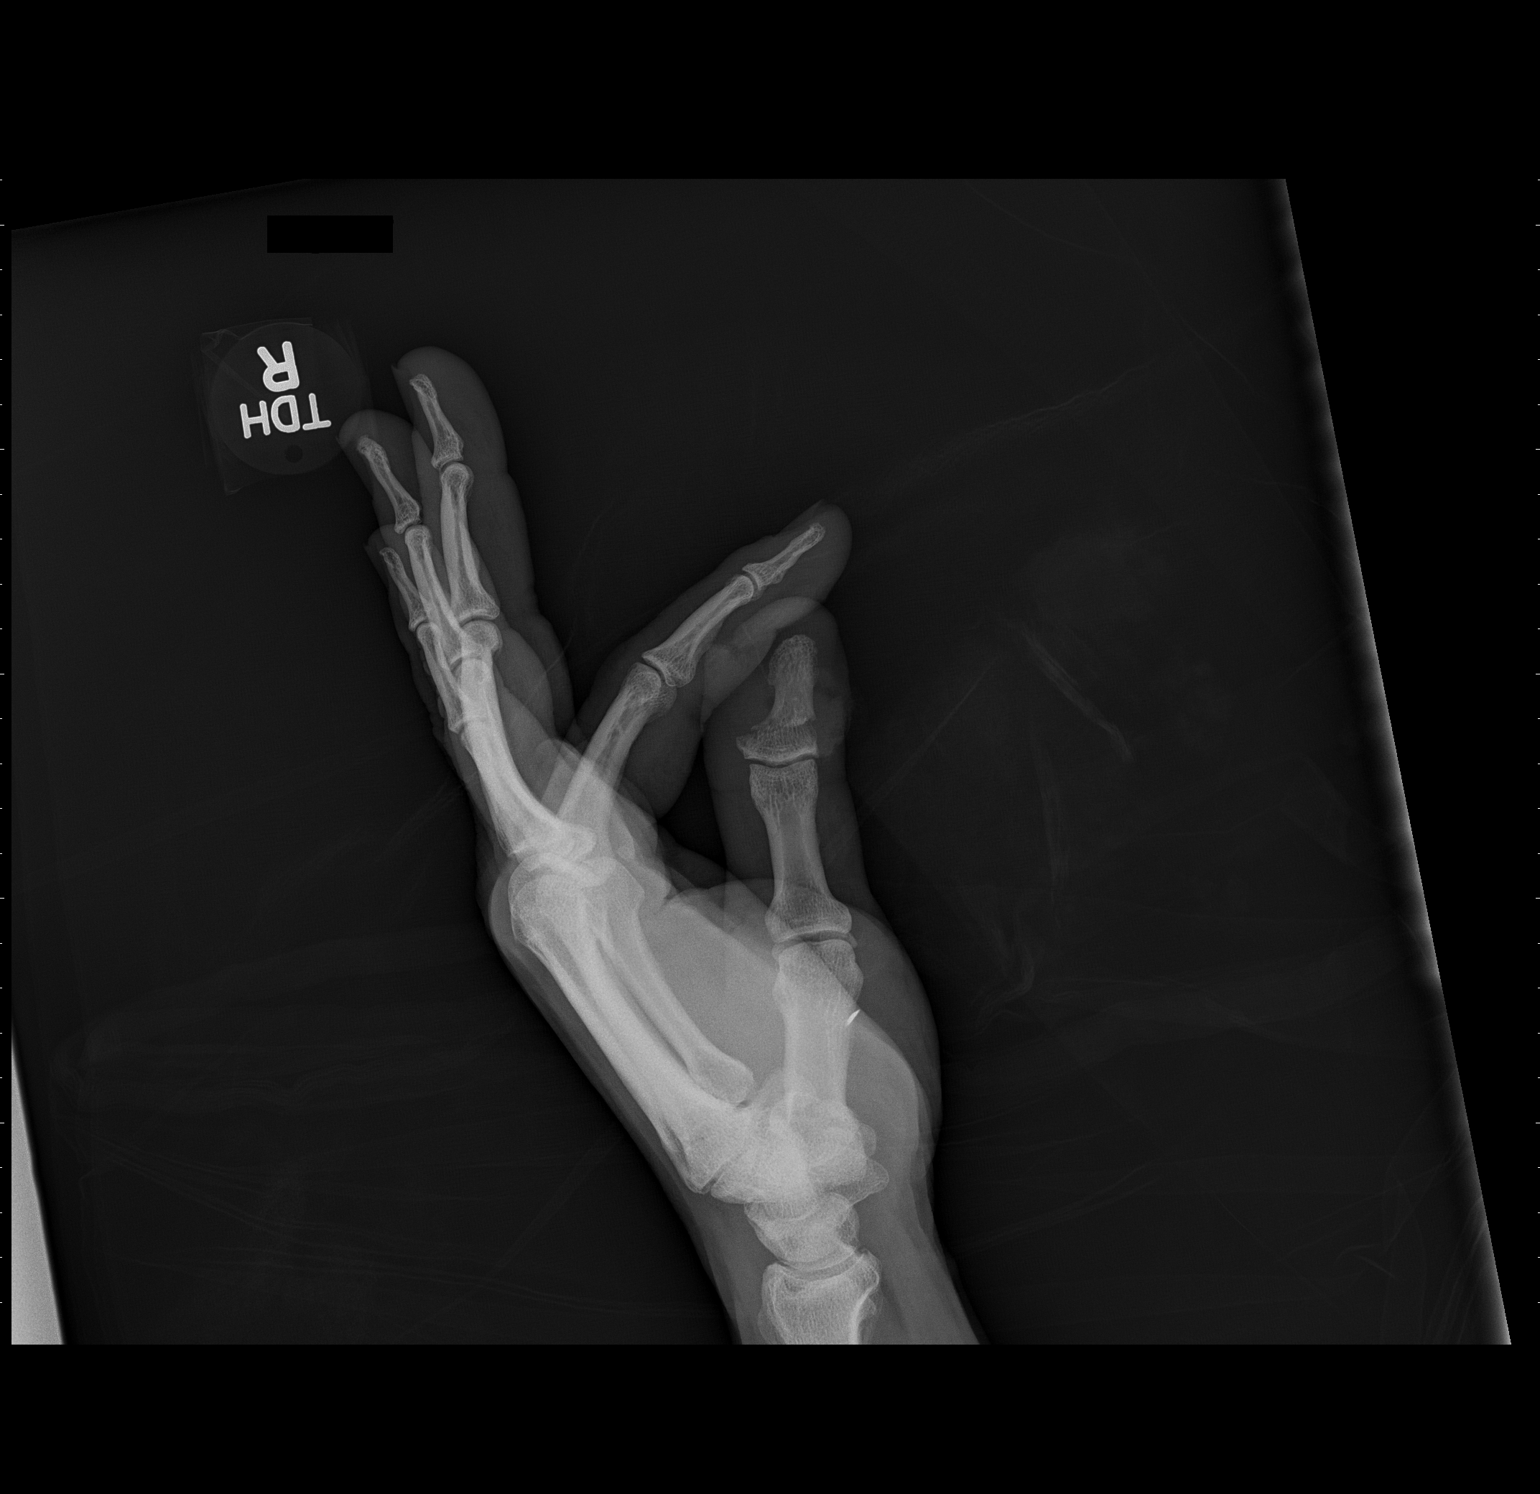

[2 of 2 positions shown; findings below may reference images not displayed]

FINDINGS: There is a fracture through the metaphysis of the distal
phalanx of the first digit.  There is a ventral displacement by
approximately one bone width.  Fracture does not appear to enter
the articular surface.

There is a small 3 mm sliver like foreign body along the radial
side of the first metacarpal.
IMPRESSION: Horizontal metaphyseal fracture of the distal phalanx, first digit
on the right.

Small foreign body along the radial surface of the first
metacarpal.

## 2012-12-19 ENCOUNTER — Telehealth: Payer: Self-pay | Admitting: *Deleted

## 2012-12-19 NOTE — Telephone Encounter (Addendum)
Pt complains of gout symptoms and request refill of Prednisone 20mg .  I advised Dr Al Corpus and will also advise his [employer].  Pt request rx to be sent to [facility].  Dr Al Corpus refilled the Prednisone 10mg  6 day pack, and have pt make an appt.  I left a message with Dr. Geryl Rankins orders.

## 2012-12-20 NOTE — Telephone Encounter (Signed)
Sure that will be fine and have him in for evaluation.

## 2012-12-21 ENCOUNTER — Other Ambulatory Visit: Payer: Self-pay | Admitting: Podiatry

## 2013-01-01 ENCOUNTER — Encounter: Payer: Self-pay | Admitting: Podiatry

## 2013-01-01 ENCOUNTER — Ambulatory Visit (INDEPENDENT_AMBULATORY_CARE_PROVIDER_SITE_OTHER): Payer: Medicare Other | Admitting: Podiatry

## 2013-01-01 ENCOUNTER — Ambulatory Visit (INDEPENDENT_AMBULATORY_CARE_PROVIDER_SITE_OTHER): Payer: Medicare Other

## 2013-01-01 VITALS — BP 118/75 | HR 69 | Resp 16 | Ht 72.0 in | Wt 190.0 lb

## 2013-01-01 DIAGNOSIS — M109 Gout, unspecified: Secondary | ICD-10-CM

## 2013-01-01 DIAGNOSIS — M7750 Other enthesopathy of unspecified foot: Secondary | ICD-10-CM

## 2013-01-01 DIAGNOSIS — M79671 Pain in right foot: Secondary | ICD-10-CM

## 2013-01-01 DIAGNOSIS — M79609 Pain in unspecified limb: Secondary | ICD-10-CM

## 2013-01-01 LAB — RHEUMATOID FACTOR: Rhuematoid fact SerPl-aCnc: <10 IU/mL (ref <=14)

## 2013-01-01 LAB — SEDIMENTATION RATE: Sed Rate: 4 mm/hr (ref 0–16)

## 2013-01-01 LAB — C-REACTIVE PROTEIN: CRP: <0.5 mg/dL (ref <0.60)

## 2013-01-01 NOTE — Progress Notes (Signed)
Trevor Lowe presents today stating that 2 weeks ago woke up had a red-hot swollen joint here. As he points to the first metatarsophalangeal joint of the right foot. States it feels some better than it did however it is still painful.  Objective: Vital signs are stable he is alert and oriented x3. He has pain on in range of motion of the first metatarsophalangeal joint of the right foot there is no overlying erythema edema saline is drainage or odor. Radiographic evaluation does demonstrate early after 3 changes to the first metatarsophalangeal joint of the right foot as well as the IP joint of the hallux right.  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 Rule out gout.  Plan: We will send him out for a blood draw looking for an elevated uric acid. At this point I injected Kenalog and local anesthetic to his first metatarsophalangeal joint intra-articularly after sterile Betadine skin prep. Should his uric acid level return elevated we will start him on allopurinol immediately.

## 2013-01-02 ENCOUNTER — Other Ambulatory Visit: Payer: Self-pay | Admitting: Podiatry

## 2013-01-02 ENCOUNTER — Telehealth: Payer: Self-pay | Admitting: *Deleted

## 2013-01-02 LAB — ANA: Anti Nuclear Antibody(ANA): NEGATIVE

## 2013-01-02 MED ORDER — ALLOPURINOL 100 MG PO TABS: 150.0000 mg | ORAL_TABLET | Freq: Every day | ORAL | Status: DC

## 2013-01-02 NOTE — Telephone Encounter (Signed)
I left a message at pt's home phone, with Dr Al Corpus' orders.

## 2013-01-02 NOTE — Telephone Encounter (Signed)
Message copied by Marissa Nestle on Wed Jan 02, 2013  2:09 PM ------      Message from: HYATT, MAX T      Created: Wed Jan 02, 2013 12:17 PM       Val let [name] know that his Uric acid level is high and that I want him started on Allopurinol immediately.  I have all ready called it in for him and I will fu with him at his scheduled appointment. ------

## 2013-01-22 ENCOUNTER — Encounter: Payer: Self-pay | Admitting: Podiatry

## 2013-01-22 ENCOUNTER — Ambulatory Visit (INDEPENDENT_AMBULATORY_CARE_PROVIDER_SITE_OTHER): Payer: Medicare Other | Admitting: Podiatry

## 2013-01-22 ENCOUNTER — Ambulatory Visit: Payer: Medicare Other | Admitting: Podiatry

## 2013-01-22 VITALS — BP 118/68 | HR 70 | Resp 16

## 2013-01-22 DIAGNOSIS — Z79899 Other long term (current) drug therapy: Secondary | ICD-10-CM

## 2013-01-22 DIAGNOSIS — M7751 Other enthesopathy of right foot: Secondary | ICD-10-CM

## 2013-01-22 DIAGNOSIS — M7750 Other enthesopathy of unspecified foot: Secondary | ICD-10-CM

## 2013-01-22 NOTE — Progress Notes (Signed)
Its doing great! As she refers to his right foot, hallux. He states that he is tolerating the allopurinol very well.  Objective: Vital signs are stable he is alert and oriented x3. After the injection 2 weeks ago he is no longer having any erythema edema cellulitis drainage or odor to the right foot and no pain on palpation or range of motion of the first metatarsophalangeal joint or IP joint hallux right.  Assessment: Hyperuricemia/gout currently being treated with allopurinol 150 mg daily.  Plan: I will followup with him in 3 months at which time a sed rate and a uric acid will be performed.

## 2013-04-23 ENCOUNTER — Ambulatory Visit: Payer: Medicare Other | Admitting: Podiatry

## 2013-05-07 ENCOUNTER — Ambulatory Visit (INDEPENDENT_AMBULATORY_CARE_PROVIDER_SITE_OTHER): Payer: Medicare Other | Admitting: Podiatry

## 2013-05-07 ENCOUNTER — Encounter: Payer: Self-pay | Admitting: Podiatry

## 2013-05-07 VITALS — BP 118/68 | HR 88 | Resp 16

## 2013-05-07 DIAGNOSIS — M109 Gout, unspecified: Secondary | ICD-10-CM

## 2013-05-07 DIAGNOSIS — Z79899 Other long term (current) drug therapy: Secondary | ICD-10-CM

## 2013-05-07 LAB — SEDIMENTATION RATE: Sed Rate: 4 mm/hr (ref 0–16)

## 2013-05-07 LAB — URIC ACID: Uric Acid, Serum: 7.2 mg/dL (ref 4.0–7.8)

## 2013-05-07 NOTE — Progress Notes (Signed)
Presents today for followup of her gouty foot left first metatarsophalangeal joint. He states he has not had any flares or any gout attacks since he is been taken medication. We did start him on 150 mg of allopurinol however he states that he is only taking 100 mg a day.  Objective: Vital signs are stable he is alert and oriented x3. No changes in the bilateral foot.  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  Plan: At this point we will continue 100 mg of allopurinol daily. His prescription was refilled. We also since him for blood work to evaluate uric acid and sedimentation rate. I will followup with him if necessary.

## 2013-05-07 NOTE — Patient Instructions (Signed)

## 2013-07-01 ENCOUNTER — Telehealth: Payer: Self-pay | Admitting: *Deleted

## 2013-07-01 NOTE — Telephone Encounter (Signed)
If Trevor Lowe is only taking one allopurinol the we should increase it to 200mg /day.  That should prevent break through gout attack.  Follow up with us should the gout recur.

## 2013-07-01 NOTE — Telephone Encounter (Signed)
I've got a flare up of Gout on both feet.  I'm on 1 tablet of Allopurinol 100mg .  What should I do about this?  Anything you can advise me, give me a call.  I returned his call.  He said that he takes the Allopurinol daily.  He said he took some Indomethacin for 2 days and that seemed to have helped.  It's gone away.  He stated he is about out of Allopurinol, will need a refill soon.  I told him I would let Dr. Al CorpusHyatt know.

## 2013-07-02 MED ORDER — ALLOPURINOL 100 MG PO TABS: 200.0000 mg | ORAL_TABLET | Freq: Every day | ORAL | Status: DC

## 2013-07-02 NOTE — Telephone Encounter (Signed)
I called and left several messages due to patient's voicemail cutting me off.  I informed him Dr. Al CorpusHyatt wants to change the prescription to 200mg  a day to avoid break through Gout attacks..  I e-scribed the prescription to your pharmacy.   Call with any questions.

## 2014-04-03 ENCOUNTER — Other Ambulatory Visit: Payer: Self-pay | Admitting: *Deleted

## 2014-04-03 MED ORDER — ALLOPURINOL 100 MG PO TABS: 200.0000 mg | ORAL_TABLET | Freq: Every day | ORAL | Status: AC

## 2015-03-18 DIAGNOSIS — R51 Headache: Secondary | ICD-10-CM | POA: Diagnosis not present

## 2015-03-18 DIAGNOSIS — J329 Chronic sinusitis, unspecified: Secondary | ICD-10-CM | POA: Diagnosis not present

## 2015-03-18 DIAGNOSIS — R509 Fever, unspecified: Secondary | ICD-10-CM | POA: Diagnosis not present

## 2015-03-18 DIAGNOSIS — Z299 Encounter for prophylactic measures, unspecified: Secondary | ICD-10-CM | POA: Diagnosis not present

## 2015-03-18 DIAGNOSIS — R0982 Postnasal drip: Secondary | ICD-10-CM | POA: Diagnosis not present

## 2015-03-18 DIAGNOSIS — Z789 Other specified health status: Secondary | ICD-10-CM | POA: Diagnosis not present

## 2015-05-12 DIAGNOSIS — Z299 Encounter for prophylactic measures, unspecified: Secondary | ICD-10-CM | POA: Diagnosis not present

## 2015-05-12 DIAGNOSIS — F419 Anxiety disorder, unspecified: Secondary | ICD-10-CM | POA: Diagnosis not present

## 2015-07-15 DIAGNOSIS — Z79899 Other long term (current) drug therapy: Secondary | ICD-10-CM | POA: Diagnosis not present

## 2015-07-15 DIAGNOSIS — Z125 Encounter for screening for malignant neoplasm of prostate: Secondary | ICD-10-CM | POA: Diagnosis not present

## 2015-07-15 DIAGNOSIS — E7800 Pure hypercholesterolemia, unspecified: Secondary | ICD-10-CM | POA: Diagnosis not present

## 2015-07-15 DIAGNOSIS — M109 Gout, unspecified: Secondary | ICD-10-CM | POA: Diagnosis not present

## 2015-07-15 DIAGNOSIS — Z1211 Encounter for screening for malignant neoplasm of colon: Secondary | ICD-10-CM | POA: Diagnosis not present

## 2015-07-15 DIAGNOSIS — Z6827 Body mass index (BMI) 27.0-27.9, adult: Secondary | ICD-10-CM | POA: Diagnosis not present

## 2015-07-15 DIAGNOSIS — Z299 Encounter for prophylactic measures, unspecified: Secondary | ICD-10-CM | POA: Diagnosis not present

## 2015-07-15 DIAGNOSIS — Z0000 Encounter for general adult medical examination without abnormal findings: Secondary | ICD-10-CM | POA: Diagnosis not present

## 2015-07-15 DIAGNOSIS — R5383 Other fatigue: Secondary | ICD-10-CM | POA: Diagnosis not present

## 2015-07-15 DIAGNOSIS — Z1389 Encounter for screening for other disorder: Secondary | ICD-10-CM | POA: Diagnosis not present

## 2015-07-15 DIAGNOSIS — Z7189 Other specified counseling: Secondary | ICD-10-CM | POA: Diagnosis not present

## 2015-12-23 DIAGNOSIS — Z713 Dietary counseling and surveillance: Secondary | ICD-10-CM | POA: Diagnosis not present

## 2015-12-23 DIAGNOSIS — Z299 Encounter for prophylactic measures, unspecified: Secondary | ICD-10-CM | POA: Diagnosis not present

## 2015-12-23 DIAGNOSIS — Z6828 Body mass index (BMI) 28.0-28.9, adult: Secondary | ICD-10-CM | POA: Diagnosis not present

## 2015-12-23 DIAGNOSIS — F419 Anxiety disorder, unspecified: Secondary | ICD-10-CM | POA: Diagnosis not present

## 2016-01-02 DIAGNOSIS — M79671 Pain in right foot: Secondary | ICD-10-CM | POA: Diagnosis not present

## 2016-01-02 DIAGNOSIS — M12571 Traumatic arthropathy, right ankle and foot: Secondary | ICD-10-CM | POA: Diagnosis not present

## 2016-01-02 DIAGNOSIS — T1490XS Injury, unspecified, sequela: Secondary | ICD-10-CM | POA: Diagnosis not present

## 2016-01-02 DIAGNOSIS — Z79899 Other long term (current) drug therapy: Secondary | ICD-10-CM | POA: Diagnosis not present

## 2016-01-02 DIAGNOSIS — M7989 Other specified soft tissue disorders: Secondary | ICD-10-CM | POA: Diagnosis not present

## 2016-01-02 DIAGNOSIS — M19172 Post-traumatic osteoarthritis, left ankle and foot: Secondary | ICD-10-CM | POA: Diagnosis not present

## 2016-07-01 DIAGNOSIS — Z789 Other specified health status: Secondary | ICD-10-CM | POA: Diagnosis not present

## 2016-07-01 DIAGNOSIS — Z299 Encounter for prophylactic measures, unspecified: Secondary | ICD-10-CM | POA: Diagnosis not present

## 2016-07-01 DIAGNOSIS — G809 Cerebral palsy, unspecified: Secondary | ICD-10-CM | POA: Diagnosis not present

## 2016-07-01 DIAGNOSIS — Z6828 Body mass index (BMI) 28.0-28.9, adult: Secondary | ICD-10-CM | POA: Diagnosis not present

## 2016-07-01 DIAGNOSIS — M549 Dorsalgia, unspecified: Secondary | ICD-10-CM | POA: Diagnosis not present

## 2017-01-05 DIAGNOSIS — F419 Anxiety disorder, unspecified: Secondary | ICD-10-CM | POA: Diagnosis not present

## 2017-01-05 DIAGNOSIS — Z789 Other specified health status: Secondary | ICD-10-CM | POA: Diagnosis not present

## 2017-01-05 DIAGNOSIS — M545 Low back pain: Secondary | ICD-10-CM | POA: Diagnosis not present

## 2017-01-05 DIAGNOSIS — Z299 Encounter for prophylactic measures, unspecified: Secondary | ICD-10-CM | POA: Diagnosis not present

## 2017-01-05 DIAGNOSIS — M109 Gout, unspecified: Secondary | ICD-10-CM | POA: Diagnosis not present

## 2017-01-05 DIAGNOSIS — Z2821 Immunization not carried out because of patient refusal: Secondary | ICD-10-CM | POA: Diagnosis not present

## 2017-01-05 DIAGNOSIS — G809 Cerebral palsy, unspecified: Secondary | ICD-10-CM | POA: Diagnosis not present

## 2017-01-05 DIAGNOSIS — Z6828 Body mass index (BMI) 28.0-28.9, adult: Secondary | ICD-10-CM | POA: Diagnosis not present

## 2017-02-22 DIAGNOSIS — Z9889 Other specified postprocedural states: Secondary | ICD-10-CM | POA: Diagnosis not present

## 2017-02-22 DIAGNOSIS — M25552 Pain in left hip: Secondary | ICD-10-CM | POA: Diagnosis not present

## 2017-02-22 DIAGNOSIS — G8929 Other chronic pain: Secondary | ICD-10-CM | POA: Diagnosis not present

## 2017-02-22 DIAGNOSIS — Z299 Encounter for prophylactic measures, unspecified: Secondary | ICD-10-CM | POA: Diagnosis not present

## 2017-02-22 DIAGNOSIS — G809 Cerebral palsy, unspecified: Secondary | ICD-10-CM | POA: Diagnosis not present

## 2017-02-22 DIAGNOSIS — F419 Anxiety disorder, unspecified: Secondary | ICD-10-CM | POA: Diagnosis not present

## 2017-02-22 DIAGNOSIS — M79605 Pain in left leg: Secondary | ICD-10-CM | POA: Diagnosis not present

## 2017-02-22 DIAGNOSIS — M545 Low back pain: Secondary | ICD-10-CM | POA: Diagnosis not present

## 2017-02-22 DIAGNOSIS — M25551 Pain in right hip: Secondary | ICD-10-CM | POA: Diagnosis not present

## 2017-02-22 DIAGNOSIS — M549 Dorsalgia, unspecified: Secondary | ICD-10-CM | POA: Diagnosis not present

## 2017-02-22 DIAGNOSIS — M79604 Pain in right leg: Secondary | ICD-10-CM | POA: Diagnosis not present

## 2017-02-22 DIAGNOSIS — Z6828 Body mass index (BMI) 28.0-28.9, adult: Secondary | ICD-10-CM | POA: Diagnosis not present

## 2017-02-22 DIAGNOSIS — Z79899 Other long term (current) drug therapy: Secondary | ICD-10-CM | POA: Diagnosis not present

## 2017-03-01 DIAGNOSIS — Z299 Encounter for prophylactic measures, unspecified: Secondary | ICD-10-CM | POA: Diagnosis not present

## 2017-03-01 DIAGNOSIS — Z6829 Body mass index (BMI) 29.0-29.9, adult: Secondary | ICD-10-CM | POA: Diagnosis not present

## 2017-03-01 DIAGNOSIS — M549 Dorsalgia, unspecified: Secondary | ICD-10-CM | POA: Diagnosis not present

## 2017-03-01 DIAGNOSIS — H9190 Unspecified hearing loss, unspecified ear: Secondary | ICD-10-CM | POA: Diagnosis not present

## 2017-03-01 DIAGNOSIS — G809 Cerebral palsy, unspecified: Secondary | ICD-10-CM | POA: Diagnosis not present

## 2017-03-01 DIAGNOSIS — E7800 Pure hypercholesterolemia, unspecified: Secondary | ICD-10-CM | POA: Diagnosis not present

## 2017-03-08 DIAGNOSIS — M5127 Other intervertebral disc displacement, lumbosacral region: Secondary | ICD-10-CM | POA: Diagnosis not present

## 2017-03-08 DIAGNOSIS — M48061 Spinal stenosis, lumbar region without neurogenic claudication: Secondary | ICD-10-CM | POA: Diagnosis not present

## 2017-03-08 DIAGNOSIS — M545 Low back pain: Secondary | ICD-10-CM | POA: Diagnosis not present

## 2017-03-08 DIAGNOSIS — M5126 Other intervertebral disc displacement, lumbar region: Secondary | ICD-10-CM | POA: Diagnosis not present

## 2017-03-09 DIAGNOSIS — J069 Acute upper respiratory infection, unspecified: Secondary | ICD-10-CM | POA: Diagnosis not present

## 2017-03-09 DIAGNOSIS — Z6828 Body mass index (BMI) 28.0-28.9, adult: Secondary | ICD-10-CM | POA: Diagnosis not present

## 2017-03-09 DIAGNOSIS — Z789 Other specified health status: Secondary | ICD-10-CM | POA: Diagnosis not present

## 2017-03-09 DIAGNOSIS — Z299 Encounter for prophylactic measures, unspecified: Secondary | ICD-10-CM | POA: Diagnosis not present

## 2017-03-09 DIAGNOSIS — G809 Cerebral palsy, unspecified: Secondary | ICD-10-CM | POA: Diagnosis not present

## 2017-03-15 DIAGNOSIS — M549 Dorsalgia, unspecified: Secondary | ICD-10-CM | POA: Diagnosis not present

## 2017-03-15 DIAGNOSIS — G809 Cerebral palsy, unspecified: Secondary | ICD-10-CM | POA: Diagnosis not present

## 2017-03-15 DIAGNOSIS — Z789 Other specified health status: Secondary | ICD-10-CM | POA: Diagnosis not present

## 2017-03-15 DIAGNOSIS — Z299 Encounter for prophylactic measures, unspecified: Secondary | ICD-10-CM | POA: Diagnosis not present

## 2017-03-15 DIAGNOSIS — Z6828 Body mass index (BMI) 28.0-28.9, adult: Secondary | ICD-10-CM | POA: Diagnosis not present

## 2017-03-15 DIAGNOSIS — J069 Acute upper respiratory infection, unspecified: Secondary | ICD-10-CM | POA: Diagnosis not present

## 2017-04-04 DIAGNOSIS — Z6830 Body mass index (BMI) 30.0-30.9, adult: Secondary | ICD-10-CM | POA: Diagnosis not present

## 2017-04-04 DIAGNOSIS — M5126 Other intervertebral disc displacement, lumbar region: Secondary | ICD-10-CM | POA: Diagnosis not present

## 2017-04-04 DIAGNOSIS — M5416 Radiculopathy, lumbar region: Secondary | ICD-10-CM | POA: Diagnosis not present

## 2017-06-30 DIAGNOSIS — M545 Low back pain: Secondary | ICD-10-CM | POA: Diagnosis not present

## 2017-06-30 DIAGNOSIS — Z6829 Body mass index (BMI) 29.0-29.9, adult: Secondary | ICD-10-CM | POA: Diagnosis not present

## 2017-06-30 DIAGNOSIS — Z299 Encounter for prophylactic measures, unspecified: Secondary | ICD-10-CM | POA: Diagnosis not present

## 2017-06-30 DIAGNOSIS — Z713 Dietary counseling and surveillance: Secondary | ICD-10-CM | POA: Diagnosis not present

## 2017-08-09 DIAGNOSIS — F419 Anxiety disorder, unspecified: Secondary | ICD-10-CM | POA: Diagnosis not present

## 2017-08-09 DIAGNOSIS — Z6828 Body mass index (BMI) 28.0-28.9, adult: Secondary | ICD-10-CM | POA: Diagnosis not present

## 2017-08-09 DIAGNOSIS — Z789 Other specified health status: Secondary | ICD-10-CM | POA: Diagnosis not present

## 2017-08-09 DIAGNOSIS — Z299 Encounter for prophylactic measures, unspecified: Secondary | ICD-10-CM | POA: Diagnosis not present

## 2017-08-09 DIAGNOSIS — J069 Acute upper respiratory infection, unspecified: Secondary | ICD-10-CM | POA: Diagnosis not present

## 2017-08-09 DIAGNOSIS — G809 Cerebral palsy, unspecified: Secondary | ICD-10-CM | POA: Diagnosis not present

## 2017-09-01 DIAGNOSIS — M545 Low back pain: Secondary | ICD-10-CM | POA: Diagnosis not present

## 2017-09-01 DIAGNOSIS — R030 Elevated blood-pressure reading, without diagnosis of hypertension: Secondary | ICD-10-CM | POA: Diagnosis not present

## 2017-09-01 DIAGNOSIS — Z6829 Body mass index (BMI) 29.0-29.9, adult: Secondary | ICD-10-CM | POA: Diagnosis not present

## 2017-12-06 DIAGNOSIS — Z299 Encounter for prophylactic measures, unspecified: Secondary | ICD-10-CM | POA: Diagnosis not present

## 2017-12-06 DIAGNOSIS — M549 Dorsalgia, unspecified: Secondary | ICD-10-CM | POA: Diagnosis not present

## 2017-12-06 DIAGNOSIS — Z6829 Body mass index (BMI) 29.0-29.9, adult: Secondary | ICD-10-CM | POA: Diagnosis not present

## 2017-12-22 DIAGNOSIS — Z299 Encounter for prophylactic measures, unspecified: Secondary | ICD-10-CM | POA: Diagnosis not present

## 2017-12-22 DIAGNOSIS — R5383 Other fatigue: Secondary | ICD-10-CM | POA: Diagnosis not present

## 2017-12-22 DIAGNOSIS — G809 Cerebral palsy, unspecified: Secondary | ICD-10-CM | POA: Diagnosis not present

## 2017-12-22 DIAGNOSIS — Z7189 Other specified counseling: Secondary | ICD-10-CM | POA: Diagnosis not present

## 2017-12-22 DIAGNOSIS — Z1339 Encounter for screening examination for other mental health and behavioral disorders: Secondary | ICD-10-CM | POA: Diagnosis not present

## 2017-12-22 DIAGNOSIS — Z6828 Body mass index (BMI) 28.0-28.9, adult: Secondary | ICD-10-CM | POA: Diagnosis not present

## 2017-12-22 DIAGNOSIS — Z1211 Encounter for screening for malignant neoplasm of colon: Secondary | ICD-10-CM | POA: Diagnosis not present

## 2017-12-22 DIAGNOSIS — Z2821 Immunization not carried out because of patient refusal: Secondary | ICD-10-CM | POA: Diagnosis not present

## 2017-12-22 DIAGNOSIS — Z0000 Encounter for general adult medical examination without abnormal findings: Secondary | ICD-10-CM | POA: Diagnosis not present

## 2017-12-22 DIAGNOSIS — Z1331 Encounter for screening for depression: Secondary | ICD-10-CM | POA: Diagnosis not present

## 2017-12-22 DIAGNOSIS — E7800 Pure hypercholesterolemia, unspecified: Secondary | ICD-10-CM | POA: Diagnosis not present

## 2017-12-22 DIAGNOSIS — Z79899 Other long term (current) drug therapy: Secondary | ICD-10-CM | POA: Diagnosis not present

## 2017-12-22 DIAGNOSIS — Z125 Encounter for screening for malignant neoplasm of prostate: Secondary | ICD-10-CM | POA: Diagnosis not present

## 2018-01-27 DIAGNOSIS — M533 Sacrococcygeal disorders, not elsewhere classified: Secondary | ICD-10-CM | POA: Diagnosis not present

## 2018-01-27 DIAGNOSIS — S32111A Minimally displaced Zone I fracture of sacrum, initial encounter for closed fracture: Secondary | ICD-10-CM | POA: Diagnosis not present

## 2018-01-27 DIAGNOSIS — Z79899 Other long term (current) drug therapy: Secondary | ICD-10-CM | POA: Diagnosis not present

## 2018-01-27 DIAGNOSIS — W19XXXA Unspecified fall, initial encounter: Secondary | ICD-10-CM | POA: Diagnosis not present

## 2018-01-27 DIAGNOSIS — S34139A Unspecified injury to sacral spinal cord, initial encounter: Secondary | ICD-10-CM | POA: Diagnosis not present

## 2018-01-27 DIAGNOSIS — S3993XA Unspecified injury of pelvis, initial encounter: Secondary | ICD-10-CM | POA: Diagnosis not present

## 2018-01-27 DIAGNOSIS — G809 Cerebral palsy, unspecified: Secondary | ICD-10-CM | POA: Diagnosis not present

## 2018-01-27 DIAGNOSIS — R52 Pain, unspecified: Secondary | ICD-10-CM | POA: Diagnosis not present

## 2018-01-30 DIAGNOSIS — S3210XA Unspecified fracture of sacrum, initial encounter for closed fracture: Secondary | ICD-10-CM | POA: Diagnosis not present

## 2018-01-30 DIAGNOSIS — M545 Low back pain: Secondary | ICD-10-CM | POA: Diagnosis not present

## 2018-01-30 DIAGNOSIS — Z299 Encounter for prophylactic measures, unspecified: Secondary | ICD-10-CM | POA: Diagnosis not present

## 2018-01-30 DIAGNOSIS — M533 Sacrococcygeal disorders, not elsewhere classified: Secondary | ICD-10-CM | POA: Diagnosis not present

## 2018-01-30 DIAGNOSIS — Z789 Other specified health status: Secondary | ICD-10-CM | POA: Diagnosis not present

## 2018-01-30 DIAGNOSIS — G809 Cerebral palsy, unspecified: Secondary | ICD-10-CM | POA: Diagnosis not present

## 2018-01-30 DIAGNOSIS — Z6829 Body mass index (BMI) 29.0-29.9, adult: Secondary | ICD-10-CM | POA: Diagnosis not present

## 2018-01-31 DIAGNOSIS — M5126 Other intervertebral disc displacement, lumbar region: Secondary | ICD-10-CM | POA: Diagnosis not present

## 2018-02-15 DIAGNOSIS — R030 Elevated blood-pressure reading, without diagnosis of hypertension: Secondary | ICD-10-CM | POA: Diagnosis not present

## 2018-02-15 DIAGNOSIS — M7918 Myalgia, other site: Secondary | ICD-10-CM | POA: Diagnosis not present

## 2018-02-15 DIAGNOSIS — Z6830 Body mass index (BMI) 30.0-30.9, adult: Secondary | ICD-10-CM | POA: Diagnosis not present

## 2018-02-15 DIAGNOSIS — M545 Low back pain: Secondary | ICD-10-CM | POA: Diagnosis not present

## 2018-02-22 DIAGNOSIS — G809 Cerebral palsy, unspecified: Secondary | ICD-10-CM | POA: Diagnosis not present

## 2018-02-22 DIAGNOSIS — M533 Sacrococcygeal disorders, not elsewhere classified: Secondary | ICD-10-CM | POA: Diagnosis not present

## 2018-02-22 DIAGNOSIS — Z299 Encounter for prophylactic measures, unspecified: Secondary | ICD-10-CM | POA: Diagnosis not present

## 2018-02-22 DIAGNOSIS — J069 Acute upper respiratory infection, unspecified: Secondary | ICD-10-CM | POA: Diagnosis not present

## 2018-02-22 DIAGNOSIS — F419 Anxiety disorder, unspecified: Secondary | ICD-10-CM | POA: Diagnosis not present

## 2018-02-22 DIAGNOSIS — Z6829 Body mass index (BMI) 29.0-29.9, adult: Secondary | ICD-10-CM | POA: Diagnosis not present

## 2018-04-16 DIAGNOSIS — M5136 Other intervertebral disc degeneration, lumbar region: Secondary | ICD-10-CM | POA: Diagnosis not present

## 2018-05-30 DIAGNOSIS — G808 Other cerebral palsy: Secondary | ICD-10-CM | POA: Diagnosis not present

## 2018-05-30 DIAGNOSIS — M5136 Other intervertebral disc degeneration, lumbar region: Secondary | ICD-10-CM | POA: Diagnosis not present

## 2018-07-27 DIAGNOSIS — M109 Gout, unspecified: Secondary | ICD-10-CM | POA: Diagnosis not present

## 2018-07-27 DIAGNOSIS — Z299 Encounter for prophylactic measures, unspecified: Secondary | ICD-10-CM | POA: Diagnosis not present

## 2018-07-27 DIAGNOSIS — M545 Low back pain: Secondary | ICD-10-CM | POA: Diagnosis not present

## 2018-07-27 DIAGNOSIS — Z6829 Body mass index (BMI) 29.0-29.9, adult: Secondary | ICD-10-CM | POA: Diagnosis not present

## 2018-07-27 DIAGNOSIS — F419 Anxiety disorder, unspecified: Secondary | ICD-10-CM | POA: Diagnosis not present

## 2018-07-27 DIAGNOSIS — Z789 Other specified health status: Secondary | ICD-10-CM | POA: Diagnosis not present

## 2018-07-27 DIAGNOSIS — G809 Cerebral palsy, unspecified: Secondary | ICD-10-CM | POA: Diagnosis not present

## 2018-10-26 DIAGNOSIS — M109 Gout, unspecified: Secondary | ICD-10-CM | POA: Diagnosis not present

## 2018-10-26 DIAGNOSIS — M549 Dorsalgia, unspecified: Secondary | ICD-10-CM | POA: Diagnosis not present

## 2018-10-26 DIAGNOSIS — Z6829 Body mass index (BMI) 29.0-29.9, adult: Secondary | ICD-10-CM | POA: Diagnosis not present

## 2018-10-26 DIAGNOSIS — Z299 Encounter for prophylactic measures, unspecified: Secondary | ICD-10-CM | POA: Diagnosis not present

## 2018-10-26 DIAGNOSIS — F419 Anxiety disorder, unspecified: Secondary | ICD-10-CM | POA: Diagnosis not present

## 2019-08-12 DIAGNOSIS — Z713 Dietary counseling and surveillance: Secondary | ICD-10-CM | POA: Diagnosis not present

## 2019-08-12 DIAGNOSIS — G809 Cerebral palsy, unspecified: Secondary | ICD-10-CM | POA: Diagnosis not present

## 2019-08-12 DIAGNOSIS — M549 Dorsalgia, unspecified: Secondary | ICD-10-CM | POA: Diagnosis not present

## 2019-08-12 DIAGNOSIS — Z299 Encounter for prophylactic measures, unspecified: Secondary | ICD-10-CM | POA: Diagnosis not present

## 2019-08-12 DIAGNOSIS — S3210XA Unspecified fracture of sacrum, initial encounter for closed fracture: Secondary | ICD-10-CM | POA: Diagnosis not present

## 2019-08-20 DIAGNOSIS — M545 Low back pain: Secondary | ICD-10-CM | POA: Diagnosis not present

## 2019-08-22 DIAGNOSIS — M545 Low back pain: Secondary | ICD-10-CM | POA: Diagnosis not present

## 2019-08-27 DIAGNOSIS — M545 Low back pain: Secondary | ICD-10-CM | POA: Diagnosis not present

## 2019-09-03 DIAGNOSIS — M545 Low back pain: Secondary | ICD-10-CM | POA: Diagnosis not present

## 2019-09-05 DIAGNOSIS — M545 Low back pain: Secondary | ICD-10-CM | POA: Diagnosis not present

## 2019-09-12 DIAGNOSIS — M545 Low back pain: Secondary | ICD-10-CM | POA: Diagnosis not present

## 2019-12-26 DIAGNOSIS — G809 Cerebral palsy, unspecified: Secondary | ICD-10-CM | POA: Diagnosis not present

## 2019-12-26 DIAGNOSIS — M549 Dorsalgia, unspecified: Secondary | ICD-10-CM | POA: Diagnosis not present

## 2019-12-26 DIAGNOSIS — F419 Anxiety disorder, unspecified: Secondary | ICD-10-CM | POA: Diagnosis not present

## 2019-12-26 DIAGNOSIS — Z2821 Immunization not carried out because of patient refusal: Secondary | ICD-10-CM | POA: Diagnosis not present

## 2019-12-26 DIAGNOSIS — Z299 Encounter for prophylactic measures, unspecified: Secondary | ICD-10-CM | POA: Diagnosis not present

## 2019-12-26 DIAGNOSIS — M109 Gout, unspecified: Secondary | ICD-10-CM | POA: Diagnosis not present

## 2019-12-26 DIAGNOSIS — Z789 Other specified health status: Secondary | ICD-10-CM | POA: Diagnosis not present

## 2019-12-31 DIAGNOSIS — J069 Acute upper respiratory infection, unspecified: Secondary | ICD-10-CM | POA: Diagnosis not present

## 2019-12-31 DIAGNOSIS — G809 Cerebral palsy, unspecified: Secondary | ICD-10-CM | POA: Diagnosis not present

## 2019-12-31 DIAGNOSIS — S3210XA Unspecified fracture of sacrum, initial encounter for closed fracture: Secondary | ICD-10-CM | POA: Diagnosis not present

## 2019-12-31 DIAGNOSIS — Z299 Encounter for prophylactic measures, unspecified: Secondary | ICD-10-CM | POA: Diagnosis not present

## 2019-12-31 DIAGNOSIS — Z20822 Contact with and (suspected) exposure to covid-19: Secondary | ICD-10-CM | POA: Diagnosis not present

## 2020-01-07 DIAGNOSIS — R059 Cough, unspecified: Secondary | ICD-10-CM | POA: Diagnosis not present

## 2020-01-07 DIAGNOSIS — J069 Acute upper respiratory infection, unspecified: Secondary | ICD-10-CM | POA: Diagnosis not present

## 2020-01-07 DIAGNOSIS — Z789 Other specified health status: Secondary | ICD-10-CM | POA: Diagnosis not present

## 2020-01-07 DIAGNOSIS — Z299 Encounter for prophylactic measures, unspecified: Secondary | ICD-10-CM | POA: Diagnosis not present

## 2020-03-25 DIAGNOSIS — Z299 Encounter for prophylactic measures, unspecified: Secondary | ICD-10-CM | POA: Diagnosis not present

## 2020-03-25 DIAGNOSIS — G809 Cerebral palsy, unspecified: Secondary | ICD-10-CM | POA: Diagnosis not present

## 2020-03-25 DIAGNOSIS — M109 Gout, unspecified: Secondary | ICD-10-CM | POA: Diagnosis not present

## 2020-03-25 DIAGNOSIS — Z789 Other specified health status: Secondary | ICD-10-CM | POA: Diagnosis not present

## 2020-03-25 DIAGNOSIS — Z6830 Body mass index (BMI) 30.0-30.9, adult: Secondary | ICD-10-CM | POA: Diagnosis not present

## 2020-03-25 DIAGNOSIS — F419 Anxiety disorder, unspecified: Secondary | ICD-10-CM | POA: Diagnosis not present

## 2020-07-01 DIAGNOSIS — R030 Elevated blood-pressure reading, without diagnosis of hypertension: Secondary | ICD-10-CM | POA: Diagnosis not present

## 2020-07-01 DIAGNOSIS — Z1339 Encounter for screening examination for other mental health and behavioral disorders: Secondary | ICD-10-CM | POA: Diagnosis not present

## 2020-07-01 DIAGNOSIS — R5383 Other fatigue: Secondary | ICD-10-CM | POA: Diagnosis not present

## 2020-07-01 DIAGNOSIS — Z125 Encounter for screening for malignant neoplasm of prostate: Secondary | ICD-10-CM | POA: Diagnosis not present

## 2020-07-01 DIAGNOSIS — Z6829 Body mass index (BMI) 29.0-29.9, adult: Secondary | ICD-10-CM | POA: Diagnosis not present

## 2020-07-01 DIAGNOSIS — Z299 Encounter for prophylactic measures, unspecified: Secondary | ICD-10-CM | POA: Diagnosis not present

## 2020-07-01 DIAGNOSIS — E7800 Pure hypercholesterolemia, unspecified: Secondary | ICD-10-CM | POA: Diagnosis not present

## 2020-07-01 DIAGNOSIS — Z0000 Encounter for general adult medical examination without abnormal findings: Secondary | ICD-10-CM | POA: Diagnosis not present

## 2020-07-01 DIAGNOSIS — Z79899 Other long term (current) drug therapy: Secondary | ICD-10-CM | POA: Diagnosis not present

## 2020-07-01 DIAGNOSIS — Z7189 Other specified counseling: Secondary | ICD-10-CM | POA: Diagnosis not present

## 2020-07-01 DIAGNOSIS — Z1331 Encounter for screening for depression: Secondary | ICD-10-CM | POA: Diagnosis not present

## 2020-10-08 DIAGNOSIS — T148XXA Other injury of unspecified body region, initial encounter: Secondary | ICD-10-CM | POA: Diagnosis not present

## 2020-10-08 DIAGNOSIS — G809 Cerebral palsy, unspecified: Secondary | ICD-10-CM | POA: Diagnosis not present

## 2020-10-08 DIAGNOSIS — F419 Anxiety disorder, unspecified: Secondary | ICD-10-CM | POA: Diagnosis not present

## 2020-10-08 DIAGNOSIS — Z299 Encounter for prophylactic measures, unspecified: Secondary | ICD-10-CM | POA: Diagnosis not present

## 2020-10-08 DIAGNOSIS — M109 Gout, unspecified: Secondary | ICD-10-CM | POA: Diagnosis not present

## 2020-10-08 DIAGNOSIS — Z6829 Body mass index (BMI) 29.0-29.9, adult: Secondary | ICD-10-CM | POA: Diagnosis not present

## 2020-10-08 DIAGNOSIS — M545 Low back pain, unspecified: Secondary | ICD-10-CM | POA: Diagnosis not present

## 2020-11-16 DIAGNOSIS — Z6829 Body mass index (BMI) 29.0-29.9, adult: Secondary | ICD-10-CM | POA: Diagnosis not present

## 2020-11-16 DIAGNOSIS — G809 Cerebral palsy, unspecified: Secondary | ICD-10-CM | POA: Diagnosis not present

## 2020-11-16 DIAGNOSIS — Z299 Encounter for prophylactic measures, unspecified: Secondary | ICD-10-CM | POA: Diagnosis not present

## 2020-11-16 DIAGNOSIS — M545 Low back pain, unspecified: Secondary | ICD-10-CM | POA: Diagnosis not present

## 2020-11-16 DIAGNOSIS — M25511 Pain in right shoulder: Secondary | ICD-10-CM | POA: Diagnosis not present

## 2020-11-18 DIAGNOSIS — Z299 Encounter for prophylactic measures, unspecified: Secondary | ICD-10-CM | POA: Diagnosis not present

## 2020-11-18 DIAGNOSIS — Z6829 Body mass index (BMI) 29.0-29.9, adult: Secondary | ICD-10-CM | POA: Diagnosis not present

## 2020-11-18 DIAGNOSIS — G809 Cerebral palsy, unspecified: Secondary | ICD-10-CM | POA: Diagnosis not present

## 2020-11-18 DIAGNOSIS — B353 Tinea pedis: Secondary | ICD-10-CM | POA: Diagnosis not present

## 2020-11-18 DIAGNOSIS — M545 Low back pain, unspecified: Secondary | ICD-10-CM | POA: Diagnosis not present

## 2020-11-18 DIAGNOSIS — R5383 Other fatigue: Secondary | ICD-10-CM | POA: Diagnosis not present

## 2021-01-04 DIAGNOSIS — M545 Low back pain, unspecified: Secondary | ICD-10-CM | POA: Diagnosis not present

## 2021-01-04 DIAGNOSIS — M1612 Unilateral primary osteoarthritis, left hip: Secondary | ICD-10-CM | POA: Diagnosis not present

## 2021-01-04 DIAGNOSIS — M25552 Pain in left hip: Secondary | ICD-10-CM | POA: Diagnosis not present

## 2021-01-06 DIAGNOSIS — Z299 Encounter for prophylactic measures, unspecified: Secondary | ICD-10-CM | POA: Diagnosis not present

## 2021-01-06 DIAGNOSIS — M25552 Pain in left hip: Secondary | ICD-10-CM | POA: Diagnosis not present

## 2021-01-06 DIAGNOSIS — Z6829 Body mass index (BMI) 29.0-29.9, adult: Secondary | ICD-10-CM | POA: Diagnosis not present

## 2021-01-06 DIAGNOSIS — G809 Cerebral palsy, unspecified: Secondary | ICD-10-CM | POA: Diagnosis not present

## 2021-02-11 DIAGNOSIS — Z299 Encounter for prophylactic measures, unspecified: Secondary | ICD-10-CM | POA: Diagnosis not present

## 2021-02-11 DIAGNOSIS — M109 Gout, unspecified: Secondary | ICD-10-CM | POA: Diagnosis not present

## 2021-02-11 DIAGNOSIS — G809 Cerebral palsy, unspecified: Secondary | ICD-10-CM | POA: Diagnosis not present

## 2021-02-11 DIAGNOSIS — M549 Dorsalgia, unspecified: Secondary | ICD-10-CM | POA: Diagnosis not present

## 2021-02-11 DIAGNOSIS — Z6829 Body mass index (BMI) 29.0-29.9, adult: Secondary | ICD-10-CM | POA: Diagnosis not present

## 2021-02-11 DIAGNOSIS — Z789 Other specified health status: Secondary | ICD-10-CM | POA: Diagnosis not present

## 2021-02-11 DIAGNOSIS — F419 Anxiety disorder, unspecified: Secondary | ICD-10-CM | POA: Diagnosis not present

## 2021-02-11 DIAGNOSIS — Z2821 Immunization not carried out because of patient refusal: Secondary | ICD-10-CM | POA: Diagnosis not present

## 2021-07-01 DIAGNOSIS — I10 Essential (primary) hypertension: Secondary | ICD-10-CM | POA: Diagnosis not present

## 2021-07-01 DIAGNOSIS — Z789 Other specified health status: Secondary | ICD-10-CM | POA: Diagnosis not present

## 2021-07-01 DIAGNOSIS — Z125 Encounter for screening for malignant neoplasm of prostate: Secondary | ICD-10-CM | POA: Diagnosis not present

## 2021-07-01 DIAGNOSIS — R5383 Other fatigue: Secondary | ICD-10-CM | POA: Diagnosis not present

## 2021-07-01 DIAGNOSIS — I700 Atherosclerosis of aorta: Secondary | ICD-10-CM | POA: Diagnosis not present

## 2021-07-01 DIAGNOSIS — E7800 Pure hypercholesterolemia, unspecified: Secondary | ICD-10-CM | POA: Diagnosis not present

## 2021-07-01 DIAGNOSIS — Z1211 Encounter for screening for malignant neoplasm of colon: Secondary | ICD-10-CM | POA: Diagnosis not present

## 2021-07-01 DIAGNOSIS — Z79899 Other long term (current) drug therapy: Secondary | ICD-10-CM | POA: Diagnosis not present

## 2021-07-01 DIAGNOSIS — Z7189 Other specified counseling: Secondary | ICD-10-CM | POA: Diagnosis not present

## 2021-07-01 DIAGNOSIS — Z0000 Encounter for general adult medical examination without abnormal findings: Secondary | ICD-10-CM | POA: Diagnosis not present

## 2021-07-01 DIAGNOSIS — Z6829 Body mass index (BMI) 29.0-29.9, adult: Secondary | ICD-10-CM | POA: Diagnosis not present

## 2021-07-01 DIAGNOSIS — Z299 Encounter for prophylactic measures, unspecified: Secondary | ICD-10-CM | POA: Diagnosis not present

## 2021-07-01 DIAGNOSIS — Z1331 Encounter for screening for depression: Secondary | ICD-10-CM | POA: Diagnosis not present

## 2021-07-01 DIAGNOSIS — I5042 Chronic combined systolic (congestive) and diastolic (congestive) heart failure: Secondary | ICD-10-CM | POA: Diagnosis not present

## 2021-07-01 DIAGNOSIS — Z1339 Encounter for screening examination for other mental health and behavioral disorders: Secondary | ICD-10-CM | POA: Diagnosis not present

## 2021-12-02 DIAGNOSIS — J329 Chronic sinusitis, unspecified: Secondary | ICD-10-CM | POA: Diagnosis not present

## 2021-12-02 DIAGNOSIS — R5383 Other fatigue: Secondary | ICD-10-CM | POA: Diagnosis not present

## 2021-12-02 DIAGNOSIS — Z299 Encounter for prophylactic measures, unspecified: Secondary | ICD-10-CM | POA: Diagnosis not present

## 2021-12-14 DIAGNOSIS — Z299 Encounter for prophylactic measures, unspecified: Secondary | ICD-10-CM | POA: Diagnosis not present

## 2021-12-14 DIAGNOSIS — J069 Acute upper respiratory infection, unspecified: Secondary | ICD-10-CM | POA: Diagnosis not present

## 2021-12-14 DIAGNOSIS — R5383 Other fatigue: Secondary | ICD-10-CM | POA: Diagnosis not present

## 2021-12-14 DIAGNOSIS — G809 Cerebral palsy, unspecified: Secondary | ICD-10-CM | POA: Diagnosis not present

## 2021-12-14 DIAGNOSIS — R509 Fever, unspecified: Secondary | ICD-10-CM | POA: Diagnosis not present

## 2021-12-24 DIAGNOSIS — Z299 Encounter for prophylactic measures, unspecified: Secondary | ICD-10-CM | POA: Diagnosis not present

## 2021-12-24 DIAGNOSIS — J069 Acute upper respiratory infection, unspecified: Secondary | ICD-10-CM | POA: Diagnosis not present

## 2021-12-24 DIAGNOSIS — R5383 Other fatigue: Secondary | ICD-10-CM | POA: Diagnosis not present

## 2022-04-06 DIAGNOSIS — M545 Low back pain, unspecified: Secondary | ICD-10-CM | POA: Diagnosis not present

## 2022-04-06 DIAGNOSIS — Z299 Encounter for prophylactic measures, unspecified: Secondary | ICD-10-CM | POA: Diagnosis not present

## 2022-04-06 DIAGNOSIS — G809 Cerebral palsy, unspecified: Secondary | ICD-10-CM | POA: Diagnosis not present

## 2022-04-06 DIAGNOSIS — F419 Anxiety disorder, unspecified: Secondary | ICD-10-CM | POA: Diagnosis not present

## 2022-04-06 DIAGNOSIS — M109 Gout, unspecified: Secondary | ICD-10-CM | POA: Diagnosis not present

## 2022-05-24 DIAGNOSIS — M76892 Other specified enthesopathies of left lower limb, excluding foot: Secondary | ICD-10-CM | POA: Diagnosis not present

## 2022-05-24 DIAGNOSIS — J45909 Unspecified asthma, uncomplicated: Secondary | ICD-10-CM | POA: Diagnosis not present

## 2022-05-24 DIAGNOSIS — G809 Cerebral palsy, unspecified: Secondary | ICD-10-CM | POA: Diagnosis not present

## 2022-05-24 DIAGNOSIS — M76891 Other specified enthesopathies of right lower limb, excluding foot: Secondary | ICD-10-CM | POA: Diagnosis not present

## 2022-09-01 DIAGNOSIS — Z79899 Other long term (current) drug therapy: Secondary | ICD-10-CM | POA: Diagnosis not present

## 2022-09-01 DIAGNOSIS — F419 Anxiety disorder, unspecified: Secondary | ICD-10-CM | POA: Diagnosis not present

## 2022-09-01 DIAGNOSIS — Z1331 Encounter for screening for depression: Secondary | ICD-10-CM | POA: Diagnosis not present

## 2022-09-01 DIAGNOSIS — Z7189 Other specified counseling: Secondary | ICD-10-CM | POA: Diagnosis not present

## 2022-09-01 DIAGNOSIS — R5383 Other fatigue: Secondary | ICD-10-CM | POA: Diagnosis not present

## 2022-09-01 DIAGNOSIS — E7800 Pure hypercholesterolemia, unspecified: Secondary | ICD-10-CM | POA: Diagnosis not present

## 2022-09-01 DIAGNOSIS — Z125 Encounter for screening for malignant neoplasm of prostate: Secondary | ICD-10-CM | POA: Diagnosis not present

## 2022-09-01 DIAGNOSIS — M109 Gout, unspecified: Secondary | ICD-10-CM | POA: Diagnosis not present

## 2022-09-01 DIAGNOSIS — Z0000 Encounter for general adult medical examination without abnormal findings: Secondary | ICD-10-CM | POA: Diagnosis not present

## 2022-09-01 DIAGNOSIS — Z1339 Encounter for screening examination for other mental health and behavioral disorders: Secondary | ICD-10-CM | POA: Diagnosis not present

## 2022-09-01 DIAGNOSIS — Z299 Encounter for prophylactic measures, unspecified: Secondary | ICD-10-CM | POA: Diagnosis not present

## 2022-09-06 DIAGNOSIS — M4316 Spondylolisthesis, lumbar region: Secondary | ICD-10-CM | POA: Diagnosis not present

## 2022-09-06 DIAGNOSIS — M5137 Other intervertebral disc degeneration, lumbosacral region: Secondary | ICD-10-CM | POA: Diagnosis not present

## 2022-09-06 DIAGNOSIS — M47816 Spondylosis without myelopathy or radiculopathy, lumbar region: Secondary | ICD-10-CM | POA: Diagnosis not present

## 2022-09-06 DIAGNOSIS — G809 Cerebral palsy, unspecified: Secondary | ICD-10-CM | POA: Diagnosis not present

## 2022-10-17 DIAGNOSIS — M25551 Pain in right hip: Secondary | ICD-10-CM | POA: Diagnosis not present

## 2022-10-17 DIAGNOSIS — M5416 Radiculopathy, lumbar region: Secondary | ICD-10-CM | POA: Diagnosis not present

## 2022-11-01 DIAGNOSIS — Z299 Encounter for prophylactic measures, unspecified: Secondary | ICD-10-CM | POA: Diagnosis not present

## 2022-11-01 DIAGNOSIS — R52 Pain, unspecified: Secondary | ICD-10-CM | POA: Diagnosis not present

## 2022-11-01 DIAGNOSIS — Z1211 Encounter for screening for malignant neoplasm of colon: Secondary | ICD-10-CM | POA: Diagnosis not present

## 2022-11-01 DIAGNOSIS — F419 Anxiety disorder, unspecified: Secondary | ICD-10-CM | POA: Diagnosis not present

## 2022-11-01 DIAGNOSIS — M109 Gout, unspecified: Secondary | ICD-10-CM | POA: Diagnosis not present

## 2022-11-01 DIAGNOSIS — M545 Low back pain, unspecified: Secondary | ICD-10-CM | POA: Diagnosis not present

## 2022-11-01 DIAGNOSIS — G809 Cerebral palsy, unspecified: Secondary | ICD-10-CM | POA: Diagnosis not present

## 2022-11-08 DIAGNOSIS — M47816 Spondylosis without myelopathy or radiculopathy, lumbar region: Secondary | ICD-10-CM | POA: Diagnosis not present

## 2022-11-09 DIAGNOSIS — M47816 Spondylosis without myelopathy or radiculopathy, lumbar region: Secondary | ICD-10-CM | POA: Diagnosis not present

## 2022-12-13 DIAGNOSIS — M47816 Spondylosis without myelopathy or radiculopathy, lumbar region: Secondary | ICD-10-CM | POA: Diagnosis not present

## 2022-12-19 DIAGNOSIS — M47816 Spondylosis without myelopathy or radiculopathy, lumbar region: Secondary | ICD-10-CM | POA: Diagnosis not present

## 2022-12-20 DIAGNOSIS — M47816 Spondylosis without myelopathy or radiculopathy, lumbar region: Secondary | ICD-10-CM | POA: Diagnosis not present

## 2023-01-05 DIAGNOSIS — M47816 Spondylosis without myelopathy or radiculopathy, lumbar region: Secondary | ICD-10-CM | POA: Diagnosis not present

## 2023-01-18 DIAGNOSIS — Z299 Encounter for prophylactic measures, unspecified: Secondary | ICD-10-CM | POA: Diagnosis not present

## 2023-01-18 DIAGNOSIS — R52 Pain, unspecified: Secondary | ICD-10-CM | POA: Diagnosis not present

## 2023-01-18 DIAGNOSIS — G809 Cerebral palsy, unspecified: Secondary | ICD-10-CM | POA: Diagnosis not present

## 2023-01-18 DIAGNOSIS — M791 Myalgia, unspecified site: Secondary | ICD-10-CM | POA: Diagnosis not present

## 2023-01-18 DIAGNOSIS — M545 Low back pain, unspecified: Secondary | ICD-10-CM | POA: Diagnosis not present

## 2023-01-23 DIAGNOSIS — M47816 Spondylosis without myelopathy or radiculopathy, lumbar region: Secondary | ICD-10-CM | POA: Diagnosis not present

## 2023-02-03 DIAGNOSIS — M47816 Spondylosis without myelopathy or radiculopathy, lumbar region: Secondary | ICD-10-CM | POA: Diagnosis not present

## 2023-02-13 DIAGNOSIS — M47816 Spondylosis without myelopathy or radiculopathy, lumbar region: Secondary | ICD-10-CM | POA: Diagnosis not present

## 2023-03-13 DIAGNOSIS — M5416 Radiculopathy, lumbar region: Secondary | ICD-10-CM | POA: Diagnosis not present

## 2023-03-13 DIAGNOSIS — M47816 Spondylosis without myelopathy or radiculopathy, lumbar region: Secondary | ICD-10-CM | POA: Diagnosis not present

## 2023-03-21 DIAGNOSIS — M1611 Unilateral primary osteoarthritis, right hip: Secondary | ICD-10-CM | POA: Diagnosis not present

## 2023-03-23 DIAGNOSIS — M545 Low back pain, unspecified: Secondary | ICD-10-CM | POA: Diagnosis not present

## 2023-03-23 DIAGNOSIS — Z299 Encounter for prophylactic measures, unspecified: Secondary | ICD-10-CM | POA: Diagnosis not present

## 2023-03-23 DIAGNOSIS — R52 Pain, unspecified: Secondary | ICD-10-CM | POA: Diagnosis not present

## 2023-03-23 DIAGNOSIS — G809 Cerebral palsy, unspecified: Secondary | ICD-10-CM | POA: Diagnosis not present

## 2023-04-21 DIAGNOSIS — M5416 Radiculopathy, lumbar region: Secondary | ICD-10-CM | POA: Diagnosis not present

## 2023-04-21 DIAGNOSIS — M1711 Unilateral primary osteoarthritis, right knee: Secondary | ICD-10-CM | POA: Diagnosis not present

## 2023-04-25 DIAGNOSIS — M1712 Unilateral primary osteoarthritis, left knee: Secondary | ICD-10-CM | POA: Diagnosis not present

## 2023-05-26 DIAGNOSIS — M5416 Radiculopathy, lumbar region: Secondary | ICD-10-CM | POA: Diagnosis not present

## 2023-08-07 DIAGNOSIS — Z299 Encounter for prophylactic measures, unspecified: Secondary | ICD-10-CM | POA: Diagnosis not present

## 2023-08-07 DIAGNOSIS — R070 Pain in throat: Secondary | ICD-10-CM | POA: Diagnosis not present

## 2023-08-07 DIAGNOSIS — J069 Acute upper respiratory infection, unspecified: Secondary | ICD-10-CM | POA: Diagnosis not present

## 2023-08-07 DIAGNOSIS — R059 Cough, unspecified: Secondary | ICD-10-CM | POA: Diagnosis not present

## 2023-08-17 DIAGNOSIS — U071 COVID-19: Secondary | ICD-10-CM | POA: Diagnosis not present

## 2023-08-17 DIAGNOSIS — Z299 Encounter for prophylactic measures, unspecified: Secondary | ICD-10-CM | POA: Diagnosis not present

## 2023-08-17 DIAGNOSIS — R110 Nausea: Secondary | ICD-10-CM | POA: Diagnosis not present

## 2023-08-17 DIAGNOSIS — R059 Cough, unspecified: Secondary | ICD-10-CM | POA: Diagnosis not present

## 2023-09-12 DIAGNOSIS — E7800 Pure hypercholesterolemia, unspecified: Secondary | ICD-10-CM | POA: Diagnosis not present

## 2023-09-12 DIAGNOSIS — Z1339 Encounter for screening examination for other mental health and behavioral disorders: Secondary | ICD-10-CM | POA: Diagnosis not present

## 2023-09-12 DIAGNOSIS — Z79899 Other long term (current) drug therapy: Secondary | ICD-10-CM | POA: Diagnosis not present

## 2023-09-12 DIAGNOSIS — R52 Pain, unspecified: Secondary | ICD-10-CM | POA: Diagnosis not present

## 2023-09-12 DIAGNOSIS — Z299 Encounter for prophylactic measures, unspecified: Secondary | ICD-10-CM | POA: Diagnosis not present

## 2023-09-12 DIAGNOSIS — Z7189 Other specified counseling: Secondary | ICD-10-CM | POA: Diagnosis not present

## 2023-09-12 DIAGNOSIS — Z0000 Encounter for general adult medical examination without abnormal findings: Secondary | ICD-10-CM | POA: Diagnosis not present

## 2023-09-12 DIAGNOSIS — Z1331 Encounter for screening for depression: Secondary | ICD-10-CM | POA: Diagnosis not present

## 2023-09-12 DIAGNOSIS — R5383 Other fatigue: Secondary | ICD-10-CM | POA: Diagnosis not present

## 2023-09-13 DIAGNOSIS — M47816 Spondylosis without myelopathy or radiculopathy, lumbar region: Secondary | ICD-10-CM | POA: Diagnosis not present

## 2023-09-13 DIAGNOSIS — M5416 Radiculopathy, lumbar region: Secondary | ICD-10-CM | POA: Diagnosis not present

## 2023-09-15 DIAGNOSIS — R5383 Other fatigue: Secondary | ICD-10-CM | POA: Diagnosis not present

## 2023-09-15 DIAGNOSIS — E7800 Pure hypercholesterolemia, unspecified: Secondary | ICD-10-CM | POA: Diagnosis not present

## 2023-09-15 DIAGNOSIS — Z79899 Other long term (current) drug therapy: Secondary | ICD-10-CM | POA: Diagnosis not present

## 2023-09-26 DIAGNOSIS — M47816 Spondylosis without myelopathy or radiculopathy, lumbar region: Secondary | ICD-10-CM | POA: Diagnosis not present

## 2023-09-26 DIAGNOSIS — M5126 Other intervertebral disc displacement, lumbar region: Secondary | ICD-10-CM | POA: Diagnosis not present

## 2023-09-26 DIAGNOSIS — M51362 Other intervertebral disc degeneration, lumbar region with discogenic back pain and lower extremity pain: Secondary | ICD-10-CM | POA: Diagnosis not present

## 2023-10-18 DIAGNOSIS — M5416 Radiculopathy, lumbar region: Secondary | ICD-10-CM | POA: Diagnosis not present

## 2023-10-25 DIAGNOSIS — M5416 Radiculopathy, lumbar region: Secondary | ICD-10-CM | POA: Diagnosis not present

## 2023-11-14 DIAGNOSIS — M5441 Lumbago with sciatica, right side: Secondary | ICD-10-CM | POA: Diagnosis not present

## 2023-11-14 DIAGNOSIS — M47816 Spondylosis without myelopathy or radiculopathy, lumbar region: Secondary | ICD-10-CM | POA: Diagnosis not present

## 2023-11-27 DIAGNOSIS — M546 Pain in thoracic spine: Secondary | ICD-10-CM | POA: Diagnosis not present

## 2023-11-27 DIAGNOSIS — M5416 Radiculopathy, lumbar region: Secondary | ICD-10-CM | POA: Diagnosis not present

## 2023-11-27 DIAGNOSIS — M47816 Spondylosis without myelopathy or radiculopathy, lumbar region: Secondary | ICD-10-CM | POA: Diagnosis not present
